# Patient Record
Sex: Female | Born: 1955 | Race: Black or African American | Hispanic: No | Marital: Single | Smoking: Current some day smoker
Health system: Southern US, Community
[De-identification: ages and names within clinical notes are randomized; demographics above are authoritative.]

## PROBLEM LIST (undated history)

## (undated) DIAGNOSIS — F329 Major depressive disorder, single episode, unspecified: Secondary | ICD-10-CM

## (undated) DIAGNOSIS — F419 Anxiety disorder, unspecified: Secondary | ICD-10-CM

## (undated) DIAGNOSIS — N979 Female infertility, unspecified: Secondary | ICD-10-CM

## (undated) DIAGNOSIS — R1110 Vomiting, unspecified: Secondary | ICD-10-CM

## (undated) DIAGNOSIS — R32 Unspecified urinary incontinence: Secondary | ICD-10-CM

## (undated) DIAGNOSIS — N809 Endometriosis, unspecified: Secondary | ICD-10-CM

## (undated) DIAGNOSIS — IMO0001 Reserved for inherently not codable concepts without codable children: Secondary | ICD-10-CM

## (undated) DIAGNOSIS — F32A Depression, unspecified: Secondary | ICD-10-CM

## (undated) DIAGNOSIS — K219 Gastro-esophageal reflux disease without esophagitis: Secondary | ICD-10-CM

## (undated) HISTORY — DX: Reserved for inherently not codable concepts without codable children: IMO0001

## (undated) HISTORY — DX: Unspecified urinary incontinence: R32

## (undated) HISTORY — DX: Vomiting, unspecified: R11.10

## (undated) HISTORY — PX: EXPLORATORY LAPAROTOMY: SUR591

## (undated) HISTORY — DX: Major depressive disorder, single episode, unspecified: F32.9

## (undated) HISTORY — PX: ESOPHAGOGASTRODUODENOSCOPY: SHX1529

## (undated) HISTORY — DX: Anxiety disorder, unspecified: F41.9

## (undated) HISTORY — DX: Female infertility, unspecified: N97.9

## (undated) HISTORY — DX: Depression, unspecified: F32.A

## (undated) HISTORY — DX: Endometriosis, unspecified: N80.9

## (undated) HISTORY — PX: OTHER SURGICAL HISTORY: SHX169

---

## 1997-11-12 ENCOUNTER — Emergency Department (HOSPITAL_COMMUNITY): Admission: EM | Admit: 1997-11-12 | Discharge: 1997-11-12 | Payer: Self-pay

## 2001-08-18 ENCOUNTER — Encounter (HOSPITAL_BASED_OUTPATIENT_CLINIC_OR_DEPARTMENT_OTHER): Payer: Self-pay | Admitting: General Surgery

## 2001-08-22 ENCOUNTER — Ambulatory Visit (HOSPITAL_COMMUNITY): Admission: RE | Admit: 2001-08-22 | Discharge: 2001-08-22 | Payer: Self-pay | Admitting: General Surgery

## 2001-08-22 ENCOUNTER — Encounter (INDEPENDENT_AMBULATORY_CARE_PROVIDER_SITE_OTHER): Payer: Self-pay | Admitting: *Deleted

## 2001-11-28 ENCOUNTER — Encounter: Admission: RE | Admit: 2001-11-28 | Discharge: 2001-12-29 | Payer: Self-pay | Admitting: Internal Medicine

## 2002-01-11 ENCOUNTER — Encounter: Admission: RE | Admit: 2002-01-11 | Discharge: 2002-02-19 | Payer: Self-pay | Admitting: Internal Medicine

## 2002-10-29 ENCOUNTER — Emergency Department (HOSPITAL_COMMUNITY): Admission: EM | Admit: 2002-10-29 | Discharge: 2002-10-30 | Payer: Self-pay | Admitting: Emergency Medicine

## 2003-12-22 ENCOUNTER — Emergency Department (HOSPITAL_COMMUNITY): Admission: EM | Admit: 2003-12-22 | Discharge: 2003-12-22 | Payer: Self-pay | Admitting: Emergency Medicine

## 2006-09-18 ENCOUNTER — Emergency Department (HOSPITAL_COMMUNITY): Admission: EM | Admit: 2006-09-18 | Discharge: 2006-09-18 | Payer: Self-pay | Admitting: Emergency Medicine

## 2007-07-06 ENCOUNTER — Other Ambulatory Visit: Admission: RE | Admit: 2007-07-06 | Discharge: 2007-07-06 | Payer: Self-pay | Admitting: Internal Medicine

## 2007-07-07 ENCOUNTER — Encounter: Admission: RE | Admit: 2007-07-07 | Discharge: 2007-07-07 | Payer: Self-pay | Admitting: Internal Medicine

## 2007-07-17 ENCOUNTER — Encounter: Payer: Self-pay | Admitting: Internal Medicine

## 2007-07-17 ENCOUNTER — Ambulatory Visit: Payer: Self-pay

## 2010-09-04 NOTE — Op Note (Signed)
[place]. [facility]  Patient:    Christine Shannon, Christine Shannon Visit Number: [id number] MRN: [id number]          Service Type: DSU Location: [facility] 2899 [id number] Attending Physician:  Sonda Primes Dictated by:   Mardene Celeste Lurene Shadow, M.D. Proc. Date: 08/22/01 Admit Date:  08/22/2001 Discharge Date: 08/22/2001                             Operative Report  PREOPERATIVE DIAGNOSIS:  Hidradenitis, left axilla.  POSTOPERATIVE DIAGNOSIS:  Hidradenitis, left axilla.  OPERATION PERFORMED:  Excision of hidradenitis, left axilla.  SURGEON:  Mardene Celeste. Lurene Shadow, M.D.  ASSISTANT:  Nurse.  ANESTHESIA:  General.  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  She comes to the operating room now for excision of this area of multiple inflammatory nodules under the left arm following a course of antibiotics. The risks and potential benefits of surgery have been fully discussed with the patient and all questions answered.  She understands these risks and gives consent.  DESCRIPTION OF PROCEDURE:  Following the induction of satisfactory anesthesia with the patient positioned supinely, the left arm extended laterally so to expose the axilla, the axilla was then prepped and draped to be included in the sterile operative field.  I outlined a large elliptical area of the axilla to include all of the palpable nodules of hidradenitis and incised down through the skin excising the ellipse in its entirety along with the underlying inflammatory tissues.  Hemostasis was maintained with electrocautery.  All areas of dissection checked for hemostasis and noted to be dry.  Sponge, instrument and sharp counts were verified.  The subcutaneous tissues were closed with interrupted 3-0 Vicryl sutures.  The skin was closed then with a 4-0 running subcuticular stitch and then reinforced  with Steri-Strips.  Sterile dressings were applied.  Anesthetic reversed.  Patient removed from the operating room to the recovery room in stable condition having tolerated the procedure well. Dictated by:   Mardene Celeste. Lurene Shadow, M.D. Attending Physician:  Sonda Primes DD:  08/22/01 TD:  08/23/01 Job: [id number]

## 2014-04-16 ENCOUNTER — Encounter: Payer: Self-pay | Admitting: Certified Nurse Midwife

## 2014-06-10 ENCOUNTER — Encounter: Payer: Self-pay | Admitting: Certified Nurse Midwife

## 2014-08-13 ENCOUNTER — Encounter: Payer: Self-pay | Admitting: Certified Nurse Midwife

## 2014-08-13 ENCOUNTER — Ambulatory Visit (INDEPENDENT_AMBULATORY_CARE_PROVIDER_SITE_OTHER): Payer: BLUE CROSS/BLUE SHIELD | Admitting: Certified Nurse Midwife

## 2014-08-13 VITALS — BP 118/76 | HR 76 | Resp 20 | Ht 60.0 in | Wt 158.0 lb

## 2014-08-13 DIAGNOSIS — Z124 Encounter for screening for malignant neoplasm of cervix: Secondary | ICD-10-CM | POA: Diagnosis not present

## 2014-08-13 DIAGNOSIS — Z23 Encounter for immunization: Secondary | ICD-10-CM | POA: Diagnosis not present

## 2014-08-13 DIAGNOSIS — Z0000 Encounter for general adult medical examination without abnormal findings: Secondary | ICD-10-CM | POA: Diagnosis not present

## 2014-08-13 DIAGNOSIS — R319 Hematuria, unspecified: Secondary | ICD-10-CM | POA: Diagnosis not present

## 2014-08-13 DIAGNOSIS — Z01419 Encounter for gynecological examination (general) (routine) without abnormal findings: Secondary | ICD-10-CM

## 2014-08-13 DIAGNOSIS — K625 Hemorrhage of anus and rectum: Secondary | ICD-10-CM | POA: Diagnosis not present

## 2014-08-13 LAB — POCT URINALYSIS DIPSTICK
Bilirubin, UA: NEGATIVE
Glucose, UA: NEGATIVE
Ketones, UA: NEGATIVE
Leukocytes, UA: NEGATIVE
Nitrite, UA: NEGATIVE
Protein, UA: NEGATIVE
Urobilinogen, UA: NEGATIVE
pH, UA: 5

## 2014-08-13 NOTE — Progress Notes (Signed)
59 y.o. . Single  African American Fe g2p2002 here to establish gyn care and  for annual exam. Post menopausal with no HRT used. Denies vaginal bleeding. Patient complaining of rectal bleeding with bowel movement and without stool. She notes blood in the toliet and from rectum when not having stool for the past 2-4 months. Denies hard stools or hemorrhoid concerns. Has old hemorrhoid tag, but not pain. Recent PCP visit and per patient CBC normal. Clomid use for one pregnancy. Slight urge incontinence occasionally, no pads use. No other UTI symptoms. Sees PCP yearly for hypertension management and labs. No other health issues today.  Patient's last menstrual period was 04/19/2002.          Sexually active: No.  The current method of family planning is post menopausal status.    Exercising: Yes.    walking & floor exercises Smoker:  no  Health Maintenance: Pap:  2000? neg MMG: 2005? normal Colonoscopy:  Greater 10 years(normal) BMD:   Unsure of year normal TDaP:  Unsure probably greater than 10 years Labs: Poct urine-rbc tr Self breast exam: done occ   reports that she has quit smoking. She does not have any smokeless tobacco history on file. She reports that she drinks alcohol. She reports that she does not use illicit drugs.  Past Medical History  Diagnosis Date  . Anxiety   . Depression   . Endometriosis   . Regurgitation     of heart  . Infertility, female   . Urinary incontinence     Past Surgical History  Procedure Laterality Date  . Exploratory laparotomy    . Cesarean section      2 c-sections  . Cyst removed      from under left armpit    No current outpatient prescriptions on file.   No current facility-administered medications for this visit.    Family History  Problem Relation Age of Onset  . Breast cancer Mother   . Stroke Mother   . Prostate cancer Father   . Diabetes Father     ROS:  Pertinent items are noted in HPI.  Otherwise, a comprehensive ROS was  negative.  Exam:   BP 118/76 mmHg  Pulse 76  Resp 20  Ht 5' (1.524 m)  Wt 158 lb (71.668 kg)  BMI 30.86 kg/m2  LMP 04/19/2002 Height: 5' (152.4 cm) Ht Readings from Last 3 Encounters:  08/13/14 5' (1.524 m)    General appearance: alert, cooperative and appears stated age Head: Normocephalic, without obvious abnormality, atraumatic Neck: no adenopathy, supple, symmetrical, trachea midline and thyroid normal to inspection and palpation Lungs: clear to auscultation bilaterally Breasts: normal appearance, no masses or tenderness, No nipple retraction or dimpling, No nipple discharge or bleeding, No axillary or supraclavicular adenopathy Heart: regular rate and rhythm Abdomen: soft, non-tender; no masses,  no organomegaly Extremities: extremities normal, atraumatic, no cyanosis or edema Skin: Skin color, texture, turgor normal. No rashes or lesions Lymph nodes: Cervical, supraclavicular, and axillary nodes normal. No abnormal inguinal nodes palpated Neurologic: Grossly normal   Pelvic: External genitalia:  no lesions              Urethra:  normal appearing urethra with no masses, tenderness or lesions              Bartholin's and Skene's: normal                 Vagina: normal appearing vagina with normal color and discharge, no lesions  Cervix: normal, non tender, no lesions              Pap taken: Yes.   Bimanual Exam:  Uterus:  normal size, contour, position, consistency, mobility, non-tender              Adnexa: normal adnexa and no mass, fullness, tenderness               Rectovaginal: Confirms               Anus:  normal sphincter tone, no lesions, small hemorrhoid tag noted, no blood on gloved finger from exam  Chaperone present: Yes  A:  Well Woman with normal exam  Post menopausal no HRT  Blood from rectum and with stools and without stools  Mammogram over due  Colonoscopy due  R/O UTI, occasional urgency  Immunization due    P:   Reviewed health and  wellness pertinent to exam  Discussed importance of advising if vaginal bleeding.  Discussed needs blood from rectum, needs evaluation. Agreeable. Referral to be made to Dr. Loreta AveMann, while patient is here. Discussed need for colonoscopy and she can discuss with GI.  Discussed importance of mammogram and given information sheet to schedule.  Lab: Urine micro,culture  Requests TDAP  Pap smear taken today with HPVHR   counseled on breast self exam, mammography screening, adequate intake of calcium and vitamin D, diet and exercise  return annually or prn  An After Visit Summary was printed and given to the patient.

## 2014-08-13 NOTE — Patient Instructions (Addendum)
EXERCISE AND DIET:  We recommended that you start or continue a regular exercise program for good health. Regular exercise means any activity that makes your heart beat faster and makes you sweat.  We recommend exercising at least 30 minutes per day at least 3 days a week, preferably 4 or 5.  We also recommend a diet low in fat and sugar.  Inactivity, poor dietary choices and obesity can cause diabetes, heart attack, stroke, and kidney damage, among others.    ALCOHOL AND SMOKING:  Women should limit their alcohol intake to no more than 7 drinks/beers/glasses of wine (combined, not each!) per week. Moderation of alcohol intake to this level decreases your risk of breast cancer and liver damage. And of course, no recreational drugs are part of a healthy lifestyle.  And absolutely no smoking or even second hand smoke. Most people know smoking can cause heart and lung diseases, but did you know it also contributes to weakening of your bones? Aging of your skin?  Yellowing of your teeth and nails?  CALCIUM AND VITAMIN D:  Adequate intake of calcium and Vitamin D are recommended.  The recommendations for exact amounts of these supplements seem to change often, but generally speaking 600 mg of calcium (either carbonate or citrate) and 800 units of Vitamin D per day seems prudent. Certain women may benefit from higher intake of Vitamin D.  If you are among these women, your doctor will have told you during your visit.    PAP SMEARS:  Pap smears, to check for cervical cancer or precancers,  have traditionally been done yearly, although recent scientific advances have shown that most women can have pap smears less often.  However, every woman still should have a physical exam from her gynecologist every year. It will include a breast check, inspection of the vulva and vagina to check for abnormal growths or skin changes, a visual exam of the cervix, and then an exam to evaluate the size and shape of the uterus and  ovaries.  And after 57 years of age, a rectal exam is indicated to check for rectal cancers. We will also provide age appropriate advice regarding health maintenance, like when you should have certain vaccines, screening for sexually transmitted diseases, bone density testing, colonoscopy, mammograms, etc.   MAMMOGRAMS:  All women over 40 years old should have a yearly mammogram. Many facilities now offer a "3D" mammogram, which may cost around $50 extra out of pocket. If possible,  we recommend you accept the option to have the 3D mammogram performed.  It both reduces the number of women who will be called back for extra views which then turn out to be normal, and it is better than the routine mammogram at detecting truly abnormal areas.    COLONOSCOPY:  Colonoscopy to screen for colon cancer is recommended for all women at age 50.  We know, you hate the idea of the prep.  We agree, BUT, having colon cancer and not knowing it is worse!!  Colon cancer so often starts as a polyp that can be seen and removed at colonscopy, which can quite literally save your life!  And if your first colonoscopy is normal and you have no family history of colon cancer, most women don't have to have it again for 10 years.  Once every ten years, you can do something that may end up saving your life, right?  We will be happy to help you get it scheduled when you are ready.    Be sure to check your insurance coverage so you understand how much it will cost.  It may be covered as a preventative service at no cost, but you should check your particular policy.     Rectal Bleeding Rectal bleeding is when blood passes out of the anus. It is usually a sign that something is wrong. It may not be serious, but it should always be evaluated. Rectal bleeding may present as bright red blood or extremely dark stools. The color may range from dark red or maroon to black (like tar). It is important that the cause of rectal bleeding be identified so  treatment can be started and the problem corrected. CAUSES   Hemorrhoids. These are enlarged (dilated) blood vessels or veins in the anal or rectal area.  Fistulas. Theseare abnormal, burrowing channels that usually run from inside the rectum to the skin around the anus. They can bleed.  Anal fissures. This is a tear in the tissue of the anus. Bleeding occurs with bowel movements.  Diverticulosis. This is a condition in which pockets or sacs project from the bowel wall. Occasionally, the sacs can bleed.  Diverticulitis. Thisis an infection involving diverticulosis of the colon.  Proctitis and colitis. These are conditions in which the rectum, colon, or both, can become inflamed and pitted (ulcerated).  Polyps and cancer. Polyps are non-cancerous (benign) growths in the colon that may bleed. Certain types of polyps turn into cancer.  Protrusion of the rectum. Part of the rectum can project from the anus and bleed.  Certain medicines.  Intestinal infections.  Blood vessel abnormalities. HOME CARE INSTRUCTIONS  Eat a high-fiber diet to keep your stool soft.  Limit activity.  Drink enough fluids to keep your urine clear or pale yellow.  Warm baths may be useful to soothe rectal pain.  Follow up with your caregiver as directed. SEEK IMMEDIATE MEDICAL CARE IF:  You develop increased bleeding.  You have black or dark red stools.  You vomit blood or material that looks like coffee grounds.  You have abdominal pain or tenderness.  You have a fever.  You feel weak, nauseous, or you faint.  You have severe rectal pain or you are unable to have a bowel movement. MAKE SURE YOU:  Understand these instructions.  Will watch your condition.  Will get help right away if you are not doing well or get worse. Document Released: 09/25/2001 Document Revised: 06/28/2011 Document Reviewed: 09/20/2010 [facility] Patient Information 2015 [employer], [place]. This information is not  intended to replace advice given to you by your health care provider. Make sure you discuss any questions you have with your health care provider.  Great to meet you today. Debbi

## 2014-08-13 NOTE — Progress Notes (Signed)
Reviewed personally.  M. Suzanne Lenzie Montesano, MD.  

## 2014-08-13 NOTE — Progress Notes (Signed)
Patient scheduled for appointment with Dr.Mann for evaluation of bright red rectal bleeding on Tuesday May 3rd at 9:45am. Patient is agreeable to date and time.

## 2014-08-14 LAB — URINALYSIS, MICROSCOPIC ONLY: CASTS: NONE SEEN

## 2014-08-15 LAB — URINE CULTURE

## 2014-08-15 LAB — IPS PAP TEST WITH HPV

## 2014-08-16 ENCOUNTER — Encounter: Payer: Self-pay | Admitting: Certified Nurse Midwife

## 2014-10-28 ENCOUNTER — Ambulatory Visit
Admission: RE | Admit: 2014-10-28 | Discharge: 2014-10-28 | Disposition: A | Discharge status: Home / Self Care | Payer: 59 | Source: Ambulatory Visit | Attending: Family Medicine | Admitting: Family Medicine

## 2014-10-28 ENCOUNTER — Other Ambulatory Visit: Payer: Self-pay | Admitting: Family Medicine

## 2014-10-28 DIAGNOSIS — M25511 Pain in right shoulder: Secondary | ICD-10-CM

## 2015-01-16 ENCOUNTER — Encounter: Payer: Self-pay | Admitting: Certified Nurse Midwife

## 2015-02-20 ENCOUNTER — Other Ambulatory Visit (HOSPITAL_COMMUNITY): Payer: Self-pay | Admitting: Otolaryngology

## 2015-02-20 DIAGNOSIS — R1310 Dysphagia, unspecified: Secondary | ICD-10-CM

## 2015-02-27 ENCOUNTER — Ambulatory Visit (HOSPITAL_COMMUNITY)
Admission: RE | Admit: 2015-02-27 | Discharge: 2015-02-27 | Disposition: A | Discharge status: Home / Self Care | Payer: 59 | Source: Ambulatory Visit | Attending: Otolaryngology | Admitting: Otolaryngology

## 2015-02-27 ENCOUNTER — Other Ambulatory Visit (HOSPITAL_COMMUNITY): Payer: 59

## 2015-02-27 DIAGNOSIS — K222 Esophageal obstruction: Secondary | ICD-10-CM | POA: Insufficient documentation

## 2015-02-27 DIAGNOSIS — R1310 Dysphagia, unspecified: Secondary | ICD-10-CM

## 2015-02-27 DIAGNOSIS — K224 Dyskinesia of esophagus: Secondary | ICD-10-CM | POA: Diagnosis not present

## 2015-02-27 DIAGNOSIS — K449 Diaphragmatic hernia without obstruction or gangrene: Secondary | ICD-10-CM | POA: Diagnosis not present

## 2015-02-27 DIAGNOSIS — K219 Gastro-esophageal reflux disease without esophagitis: Secondary | ICD-10-CM | POA: Diagnosis not present

## 2015-03-05 ENCOUNTER — Encounter: Payer: Self-pay | Admitting: Gastroenterology

## 2015-03-05 ENCOUNTER — Encounter: Payer: Self-pay | Admitting: Physician Assistant

## 2015-03-20 ENCOUNTER — Ambulatory Visit: Payer: 59 | Admitting: Physician Assistant

## 2015-05-07 ENCOUNTER — Encounter: Payer: Self-pay | Admitting: Gastroenterology

## 2015-05-07 ENCOUNTER — Ambulatory Visit (INDEPENDENT_AMBULATORY_CARE_PROVIDER_SITE_OTHER): Payer: 59 | Admitting: Gastroenterology

## 2015-05-07 VITALS — BP 132/80 | HR 76 | Ht 60.0 in | Wt 161.2 lb

## 2015-05-07 DIAGNOSIS — R1310 Dysphagia, unspecified: Secondary | ICD-10-CM | POA: Diagnosis not present

## 2015-05-07 NOTE — Patient Instructions (Signed)
You will be set up for an upper endoscopy with balloon dilation for dysphagia.

## 2015-05-07 NOTE — Progress Notes (Signed)
HPI: This is a    very pleasant 60 year old woman   who was referred to me by Melvenia Beam, MD  to evaluate  dysphasia .    Chief complaint is dysphagia  Swallowing difficulty for a long time for 10 years, this became worse this past summer.  Overall stable weight.  NO overt gi bleeding.  No pyrosis.  She was not happy with her interaction with Dr. Loreta Ave this past summer..    She has  Dysphagia to solids and liquids, daily now.  Sometimes twice a day. Will regurgitate it out usually.   11/2014 Colonoscopy Dr. Loreta Ave (done for rectal bleeding): essentially normal, no polyps: recommended repeat colonoscopy ni 10 years for screening 11/2014 EGD Dr. 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 Normal otherwise examination. She was recommended to return to the office when necessary   02/2015 MBSS: Pt presents with normal oropharyngeal swallow with adequate mastication, timely swallow initiation, good pharyngeal clearance, no penetration nor aspiration, patent UES with easy passage into esophagus. Pt is scheduled for barium swallow immediately after current study. No SLP f/u warranted. `   02/2015 barium esophagram: 1. Normal oral and pharyngeal phases of swallowing, with no laryngeal penetration or tracheobronchial aspiration. 2. Mild-to-moderate gastroesophageal reflux. Small sliding hiatal hernia. 3. 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 Swallowed a 13 mm barium tablet became lodged in the lower thoracic esophagus at the site of the stricture. 4. Mild esophageal dysmotility, likely due to chronic gastroesophageal reflux disease   Review of systems: Pertinent positive and negative review of systems were noted in the above HPI section. Complete review of systems was performed and was otherwise normal.   Past Medical History  Diagnosis Date  . Anxiety   . Depression    . Endometriosis   . Regurgitation     of heart  . Infertility, female   . Urinary incontinence     Past Surgical History  Procedure Laterality Date  . Exploratory laparotomy    . Cesarean section      2 c-sections  . Cyst removed      from under left armpit  . Esophagogastroduodenoscopy      Dr Loreta Ave    No current outpatient prescriptions on file.   No current facility-administered medications for this visit.    Allergies as of 05/07/2015  . (No Known Allergies)    Family History  Problem Relation Age of Onset  . Breast cancer Mother 23    very early first stage BRACA negative  . Stroke Mother   . Prostate cancer Father   . Diabetes Father     Social History   Social History  . Marital Status: Single    Spouse Name: N/A  . Number of Children: N/A  . Years of Education: N/A   Occupational History  . Not on file.   Social History Main Topics  . Smoking status: Former Games developer  . Smokeless tobacco: Never Used  . Alcohol Use: 0.0 oz/week    0 Standard drinks or equivalent per week     Comment: 1-2 a month  . Drug Use: No  . Sexual Activity:    Partners: Female, Female    Birth Control/ Protection: Post-menopausal   Other Topics Concern  . Not on file   Social History Narrative     Physical Exam: BP 132/80 mmHg  Pulse 76  Ht 5' (1.524 m)  [employer]  161 lb 4 oz (73.143 kg)  BMI 31.49 kg/m2  LMP 04/19/2002 Constitutional: generally well-appearing Psychiatric: alert and oriented x3 Eyes: extraocular movements intact Mouth: oral pharynx moist, no lesions Neck: supple no lymphadenopathy Cardiovascular: heart regular rate and rhythm Lungs: clear to auscultation bilaterally Abdomen: soft, nontender, nondistended, no obvious ascites, no peritoneal signs, normal bowel sounds Extremities: no lower extremity edema bilaterally Skin: no lesions on visible extremities   Assessment and plan: 60 y.o. female with  mixed solid, liquid dysphagia, abnormal barium  esophagram  I recommended we proceed with EGD at her soonest convenience and I would plan to perform dilation of the GE junction likely with a balloon. She knows to chew her food well eating slowly and take small bites in the meantime. She does not have any GERD symptoms to speak of but if there is a significant stricturing probably put her empirically on proton pump inhibitor following dilation. I see no reason for any further blood tests or imaging studies before this.   Rob Bunting, MD [facility] 05/07/2015, 1:33 PM  Cc: Melvenia Beam, MD

## 2015-06-17 ENCOUNTER — Encounter: Payer: Self-pay | Admitting: Gastroenterology

## 2015-06-17 ENCOUNTER — Ambulatory Visit (AMBULATORY_SURGERY_CENTER): Payer: 59 | Admitting: Gastroenterology

## 2015-06-17 VITALS — BP 139/77 | HR 65 | Temp 98.6°F | Resp 13 | Ht 60.0 in | Wt 161.0 lb

## 2015-06-17 DIAGNOSIS — R1310 Dysphagia, unspecified: Secondary | ICD-10-CM

## 2015-06-17 DIAGNOSIS — K222 Esophageal obstruction: Secondary | ICD-10-CM | POA: Diagnosis not present

## 2015-06-17 MED ORDER — OMEPRAZOLE 40 MG PO CPDR
40.0000 mg | DELAYED_RELEASE_CAPSULE | Freq: Every day | ORAL | Status: DC
Start: 2015-06-17 — End: 2022-02-02

## 2015-06-17 MED ORDER — SODIUM CHLORIDE 0.9 % IV SOLN: 500.0000 mL | INTRAVENOUS | Status: DC

## 2015-06-17 NOTE — Op Note (Signed)
[facility] [address] [place], [address]   ENDOSCOPY PROCEDURE REPORT  PATIENT: Christine, Shannon  MR#: [id number] BIRTHDATE: 04-Oct-1955 , 59  yrs. old GENDER: female ENDOSCOPIST: Rachael Fee, MD REFERRED BY:  Melvenia Beam, MD PROCEDURE DATE:  06/17/2015 PROCEDURE:  EGD w/ balloon dilation and EGD w/ biopsy ASA CLASS:     Class II INDICATIONS:  Dysphagia for many years, worse lately; 11/2014 EGD Dr. 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  Normal otherwise examination.  She was recommended to return to the office when necessary. MEDICATIONS: Monitored anesthesia care and Propofol 200 mg IV TOPICAL ANESTHETIC: none  DESCRIPTION OF PROCEDURE: After the risks benefits and alternatives of the procedure were thoroughly explained, informed consent was obtained.  The LB [id number] endoscope was introduced through the mouth and advanced to the second portion of the duodenum , Without limitations.  The instrument was slowly withdrawn as the mucosa was fully examined.  There was a smooth, fairly focal stricture at GE junction.  The lumen was 8mm across, mucosa in that region (distal esophagus) was somewhat granular appearing but not neoplastic.  The stricture was slightly dilated with scope passage and then I used an 11mm TTS balloon to dilate it further.  There was the usual superficial mucosal tear and self limited oozing of blood following dilation. The esophagus was biopsied (distal and proximal) and sent to pathology.  The examination was otherwise normal.  Retroflexed views revealed no abnormalities.     The scope was then withdrawn from the patient and the procedure completed.  COMPLICATIONS: There were no immediate complications.  ENDOSCOPIC IMPRESSION: There was a smooth, fairly focal stricture at GE junction.  The lumen was 8mm across, mucosa in that region (distal esophagus) was somewhat  granular appearing but not neoplastic.  The stricture was slightly dilated with scope passage and then I used an 11mm TTS balloon to dilate it further.  There was the usual superficial mucosal tear and self limited oozing of blood following dilation. The esophagus was biopsied (distal and proximal) and sent to pathology.  The examination was otherwise normal  RECOMMENDATIONS: Await biopsy results You will likely need repeat dilation, await biopsy result to determine timing. Please start once daily omeprazole (called into your pharmacy today).  This may be increased to twice daily pending biopsy results.   eSigned:  Rachael Fee, MD 06/17/2015 9:47 AM     PATIENT NAME:  Christine, Shannon MR#: [id number]

## 2015-06-17 NOTE — Patient Instructions (Signed)
YOU HAD AN ENDOSCOPIC PROCEDURE TODAY AT THE [facility]:   Refer to the procedure report that was given to you for any specific questions about what was found during the examination.  If the procedure report does not answer your questions, please call your gastroenterologist to clarify.  If you requested that your care partner not be given the details of your procedure findings, then the procedure report has been included in a sealed envelope for you to review at your convenience later.  YOU SHOULD EXPECT: Some feelings of bloating in the abdomen. Passage of more gas than usual.  Walking can help get rid of the air that was put into your GI tract during the procedure and reduce the bloating. If you had a lower endoscopy (such as a colonoscopy or flexible sigmoidoscopy) you may notice spotting of blood in your stool or on the toilet paper. If you underwent a bowel prep for your procedure, you may not have a normal bowel movement for a few days.  Please Note:  You might notice some irritation and congestion in your nose or some drainage.  This is from the oxygen used during your procedure.  There is no need for concern and it should clear up in a day or so.  SYMPTOMS TO REPORT IMMEDIATELY:     Following upper endoscopy (EGD)  Vomiting of blood or coffee ground material  New chest pain or pain under the shoulder blades  Painful or persistently difficult swallowing  New shortness of breath  Fever of 100F or higher  Black, tarry-looking stools  For urgent or emergent issues, a gastroenterologist can be reached at any hour by calling [phone].   DIET:  Follow Dilation Handout.    ACTIVITY:  You should plan to take it easy for the rest of today and you should NOT DRIVE or use heavy machinery until tomorrow (because of the sedation medicines used during the test).    FOLLOW UP: Our staff will call the number listed on your records the next business day following your  procedure to check on you and address any questions or concerns that you may have regarding the information given to you following your procedure. If we do not reach you, we will leave a message.  However, if you are feeling well and you are not experiencing any problems, there is no need to return our call.  We will assume that you have returned to your regular daily activities without incident.  If any biopsies were taken you will be contacted by phone or by letter within the next 1-3 weeks.  Please call us at [phone] if you have not heard about the biopsies in 3 weeks.    SIGNATURES/CONFIDENTIALITY: You and/or your care partner have signed paperwork which will be entered into your electronic medical record.  These signatures attest to the fact that that the information above on your After Visit Summary has been reviewed and is understood.  Full responsibility of the confidentiality of this discharge information lies with you and/or your care-partner.   Resume medications. Information given on dilation diet. 

## 2015-06-17 NOTE — Progress Notes (Signed)
Called to room to assist during endoscopic procedure.  Patient ID and intended procedure confirmed with present staff. Received instructions for my participation in the procedure from the performing physician.  

## 2015-06-17 NOTE — Progress Notes (Signed)
To PACU   Pt awake and alert report toRN 

## 2015-06-18 ENCOUNTER — Telehealth: Payer: Self-pay | Admitting: *Deleted

## 2015-06-18 NOTE — Telephone Encounter (Signed)
  Follow up Call-  Call back number 06/17/2015  Post procedure Call Back phone  # [phone]  Permission to leave phone message Yes     Patient questions:  Do you have a fever, pain , or abdominal swelling? No. Pain Score  0 *  Have you tolerated food without any problems? Yes.    Have you been able to return to your normal activities? Yes.    Do you have any questions about your discharge instructions: Diet   No. Medications  No. Follow up visit  No.  Do you have questions or concerns about your Care? No.  Actions: * If pain score is 4 or above: No action needed, pain <4.

## 2015-06-30 ENCOUNTER — Telehealth: Payer: Self-pay | Admitting: Gastroenterology

## 2015-06-30 NOTE — Telephone Encounter (Addendum)
Left message on machine to call back, the pt is due for repeat EGD in late April, she will be called around that time to set up the procedure.

## 2015-07-01 NOTE — Telephone Encounter (Signed)
The pt has been advised that the procedure will be set up a little closer to April.  She is aware to call if she has not heard from this office by April 1.  Pt agreed

## 2015-07-22 ENCOUNTER — Telehealth: Payer: Self-pay

## 2015-07-22 NOTE — Telephone Encounter (Signed)
-----   Message from Donata DuffPatty L Lewis, RN sent at 07/01/2015 10:21 AM EDT ----- Pt needs EGD

## 2015-07-29 NOTE — Telephone Encounter (Signed)
Left message on machine to call back  

## 2015-07-30 NOTE — Telephone Encounter (Signed)
Left message on machine to call back letter mailed. 

## 2015-08-21 ENCOUNTER — Ambulatory Visit: Payer: BLUE CROSS/BLUE SHIELD | Admitting: Certified Nurse Midwife

## 2017-02-20 IMAGING — CR DG SHOULDER 2+V*R*
3 series · 3 of 3 positions shown · non-contrast
Comparison: No priors.

CLINICAL DATA: 59-year-old female with worsening right shoulder
pain for the past 2 weeks. Limited range of motion.

EXAM:
RIGHT SHOULDER - 2+ VIEW

[w shoulder grashey right]
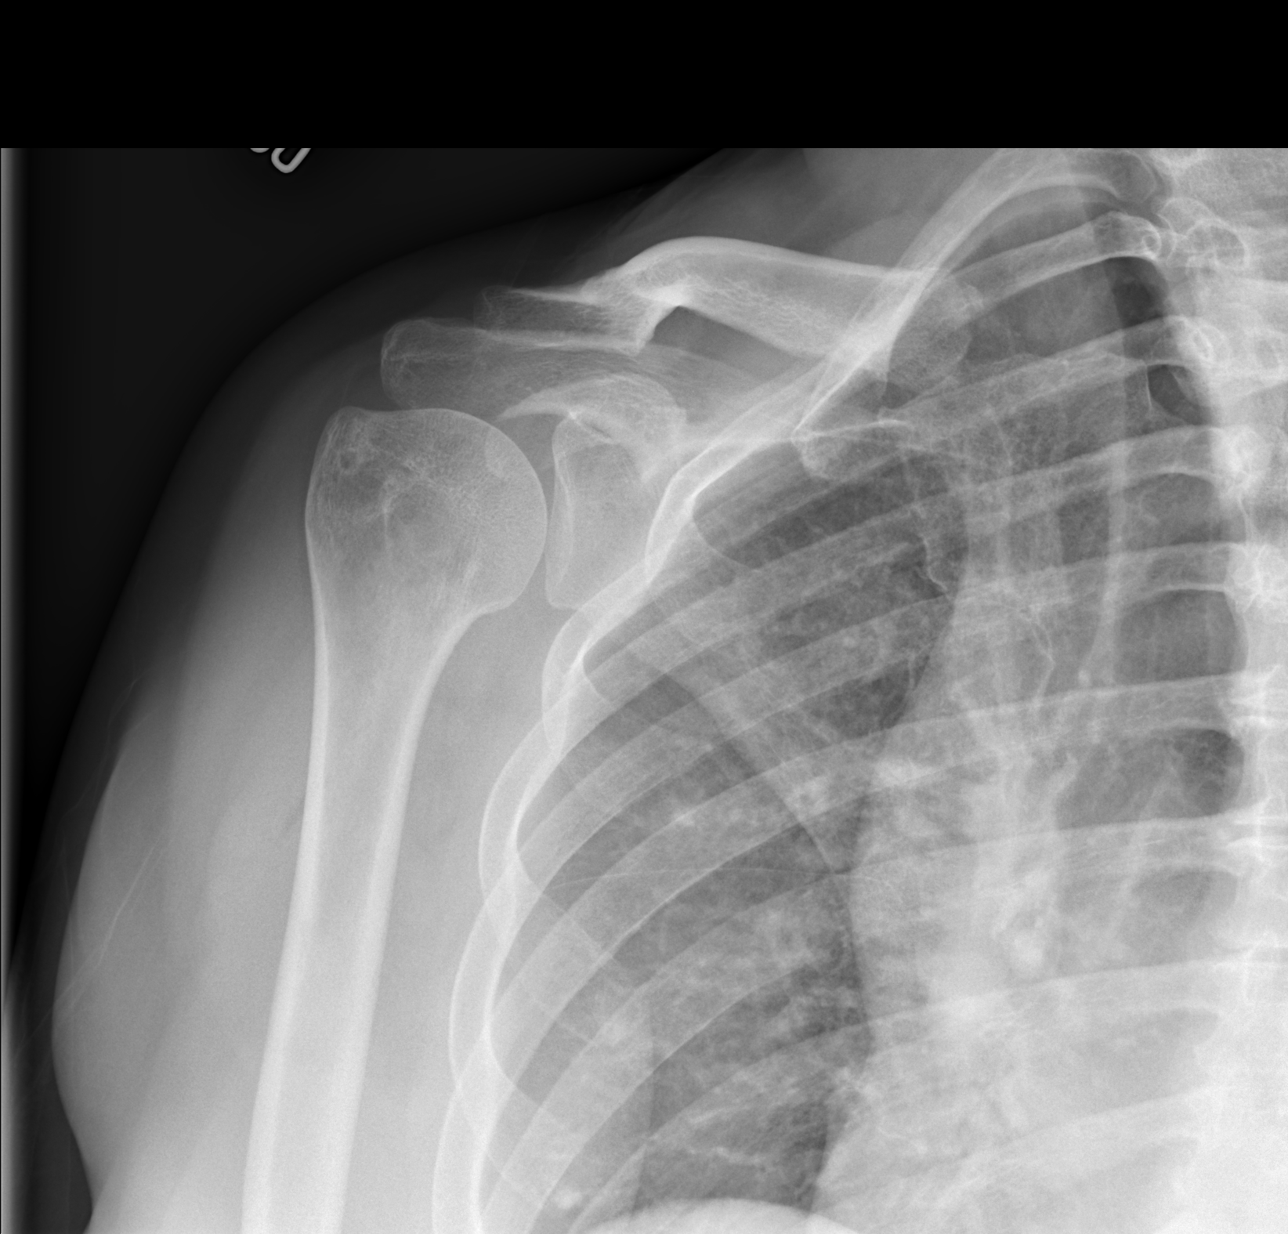

[w shoulder y-view right]
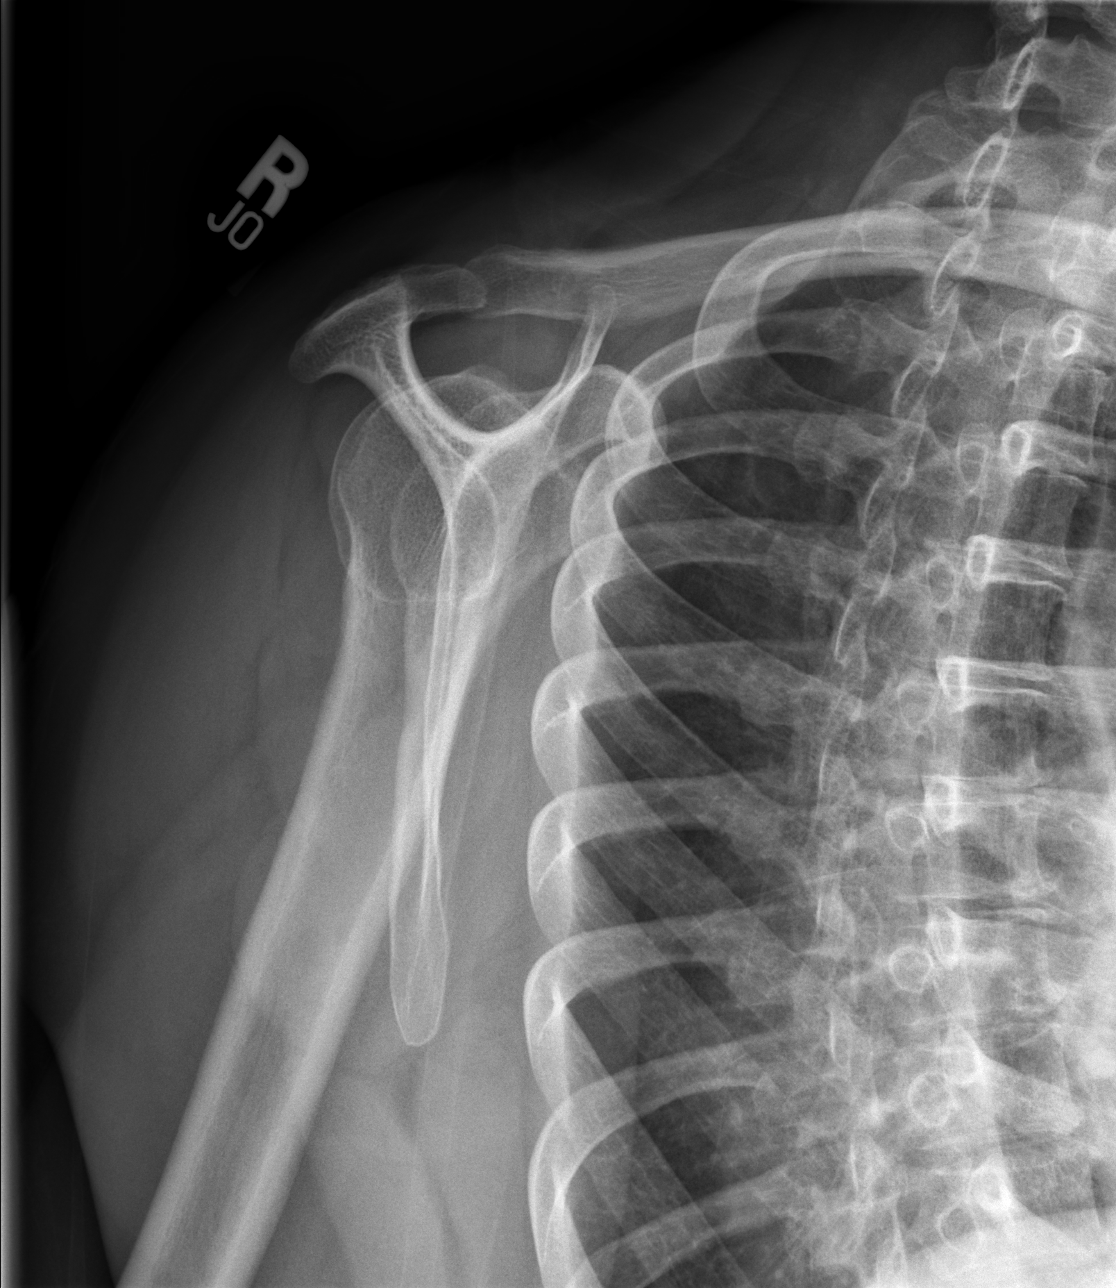

[x shoulder axillary right]
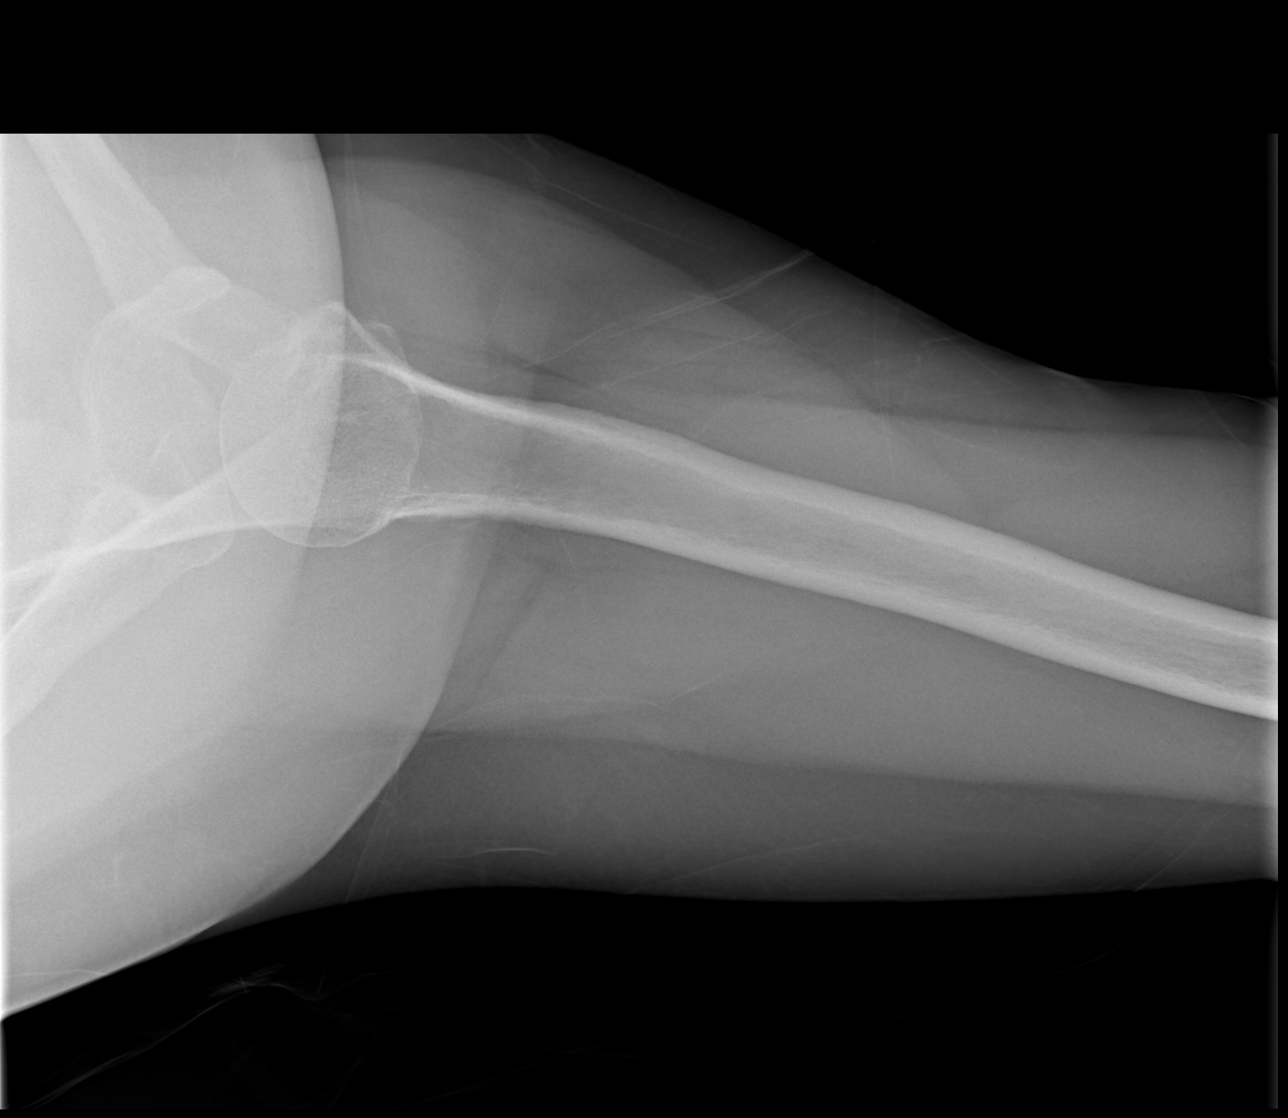

[3 of 3 positions shown; findings below may reference images not displayed]

FINDINGS: Multiple views of the right shoulder demonstrate no acute displaced
fracture, subluxation, dislocation, or soft tissue abnormality.
IMPRESSION: No acute radiographic abnormality of the right shoulder.

## 2020-02-29 ENCOUNTER — Other Ambulatory Visit: Payer: Self-pay | Admitting: Family Medicine

## 2020-02-29 ENCOUNTER — Ambulatory Visit
Admission: RE | Admit: 2020-02-29 | Discharge: 2020-02-29 | Disposition: A | Discharge status: Home / Self Care | Payer: 59 | Source: Ambulatory Visit | Attending: Family Medicine | Admitting: Family Medicine

## 2020-02-29 ENCOUNTER — Other Ambulatory Visit: Payer: Self-pay

## 2020-02-29 DIAGNOSIS — R062 Wheezing: Secondary | ICD-10-CM

## 2020-02-29 DIAGNOSIS — R059 Cough, unspecified: Secondary | ICD-10-CM

## 2020-03-24 ENCOUNTER — Other Ambulatory Visit: Payer: Self-pay | Admitting: Family Medicine

## 2020-03-24 DIAGNOSIS — Z1231 Encounter for screening mammogram for malignant neoplasm of breast: Secondary | ICD-10-CM

## 2020-03-24 DIAGNOSIS — E2839 Other primary ovarian failure: Secondary | ICD-10-CM

## 2020-06-30 ENCOUNTER — Other Ambulatory Visit: Payer: 59

## 2020-06-30 ENCOUNTER — Inpatient Hospital Stay: Admission: RE | Admit: 2020-06-30 | Payer: 59 | Source: Ambulatory Visit

## 2020-08-22 ENCOUNTER — Ambulatory Visit: Payer: 59

## 2022-01-05 ENCOUNTER — Encounter: Payer: Self-pay | Admitting: Gastroenterology

## 2022-01-17 DIAGNOSIS — J189 Pneumonia, unspecified organism: Secondary | ICD-10-CM

## 2022-01-17 HISTORY — DX: Pneumonia, unspecified organism: J18.9

## 2022-01-20 ENCOUNTER — Encounter (HOSPITAL_COMMUNITY): Payer: Self-pay

## 2022-01-20 ENCOUNTER — Ambulatory Visit (HOSPITAL_COMMUNITY)
Admission: EM | Admit: 2022-01-20 | Discharge: 2022-01-20 | Disposition: A | Discharge status: Home / Self Care | Payer: 59 | Attending: Emergency Medicine | Admitting: Emergency Medicine

## 2022-01-20 DIAGNOSIS — J069 Acute upper respiratory infection, unspecified: Secondary | ICD-10-CM | POA: Diagnosis present

## 2022-01-20 DIAGNOSIS — Z20822 Contact with and (suspected) exposure to covid-19: Secondary | ICD-10-CM | POA: Insufficient documentation

## 2022-01-20 MED ORDER — PROMETHAZINE-DM 6.25-15 MG/5ML PO SYRP: 5.0000 mL | ORAL_SOLUTION | Freq: Four times a day (QID) | ORAL | 0 refills | Status: DC | PRN

## 2022-01-20 MED ORDER — ALBUTEROL SULFATE HFA 108 (90 BASE) MCG/ACT IN AERS: 2.0000 | INHALATION_SPRAY | Freq: Four times a day (QID) | RESPIRATORY_TRACT | 2 refills | Status: DC | PRN

## 2022-01-20 MED ORDER — BENZONATATE 100 MG PO CAPS: 100.0000 mg | ORAL_CAPSULE | Freq: Three times a day (TID) | ORAL | 0 refills | Status: DC

## 2022-01-20 MED ORDER — PREDNISONE 20 MG PO TABS: 40.0000 mg | ORAL_TABLET | Freq: Every day | ORAL | 0 refills | Status: DC

## 2022-01-20 NOTE — ED Provider Notes (Signed)
[place]    CSN: [id number] Arrival date & time: 01/20/22  0802      History   Chief Complaint Chief Complaint  Patient presents with   Shortness of Breath   Cough    HPI Christine Shannon is a 66 y.o. female.   Patient presents with chills, nasal congestion, right-sided ear pain, rhinorrhea, sore throat, constant generalized headache, productive cough, chest soreness, shortness of breath with exertion and intermittent wheezing for 3 days.ear pain has resolved.  No known sick contacts.  Decreased appetite but tolerating some food and fluids.  Current smoker.  Has attempted use of Robitussin which has been helpful.   Past Medical History:  Diagnosis Date   Anxiety    Depression    Endometriosis    Infertility, female    Regurgitation    of heart   Urinary incontinence     There are no problems to display for this patient.   Past Surgical History:  Procedure Laterality Date   CESAREAN SECTION     2 c-sections   cyst removed     from under left armpit   ESOPHAGOGASTRODUODENOSCOPY     Dr Collene Mares   EXPLORATORY LAPAROTOMY      OB History     Gravida  2   Para  2   Term  2   Preterm      AB      Living  2      SAB      IAB      Ectopic      Multiple      Live Births  2            Home Medications    Prior to Admission medications   Medication Sig Start Date End Date Taking? Authorizing Provider  omeprazole (PRILOSEC) 40 MG capsule Take 1 capsule (40 mg total) by mouth daily. 06/17/15   Milus Banister, MD    Family History Family History  Problem Relation Age of Onset   Breast cancer Mother 13       very early first stage BRACA negative   Stroke Mother    Prostate cancer Father    Diabetes Father     Social History Social History   Tobacco Use   Smoking status: Former   Smokeless tobacco: Never  Substance Use Topics   Alcohol use: Yes    Alcohol/week: 0.0 standard drinks of alcohol    Comment: 1-2 a month    Drug use: No     Allergies   Patient has no known allergies.   Review of Systems Review of Systems  Constitutional:  Positive for chills. Negative for activity change, appetite change, diaphoresis, fatigue, fever and unexpected weight change.  HENT:  Positive for congestion, ear pain, rhinorrhea and sore throat. Negative for dental problem, drooling, ear discharge, facial swelling, hearing loss, mouth sores, nosebleeds, postnasal drip, sinus pressure, sinus pain, sneezing, tinnitus, trouble swallowing and voice change.   Respiratory:  Positive for cough, shortness of breath and wheezing. Negative for apnea, choking, chest tightness and stridor.   Cardiovascular: Negative.   Gastrointestinal: Negative.   Skin: Negative.   Neurological:  Positive for headaches. Negative for dizziness, tremors, seizures, syncope, facial asymmetry, speech difficulty, weakness, light-headedness and numbness.     Physical Exam Triage Vital Signs ED Triage Vitals  Enc Vitals Group     BP 01/20/22 0823 (!) 185/95     Pulse Rate 01/20/22 0823 87  Resp 01/20/22 0823 12     Temp 01/20/22 0823 99.3 F (37.4 C)     Temp Source 01/20/22 0823 Oral     SpO2 01/20/22 0823 97 %     Weight 01/20/22 0827 150 lb (68 kg)     Height 01/20/22 0827 4\' 11"  (1.499 m)     Head Circumference --      Peak Flow --      Pain Score 01/20/22 0826 7     Pain Loc --      Pain Edu? --      Excl. in GC? --    No data found.  Updated Vital Signs BP (!) 185/95 (BP Location: Left Arm)   Pulse 87   Temp 99.3 F (37.4 C) (Oral)   Resp 12   Ht 4\' 11"  (1.499 m)   Wt 150 lb (68 kg)   LMP 04/19/2002   SpO2 97%   BMI 30.30 kg/m   Visual Acuity Right Eye Distance:   Left Eye Distance:   Bilateral Distance:    Right Eye Near:   Left Eye Near:    Bilateral Near:     Physical Exam Constitutional:      Appearance: Normal appearance.  HENT:     Head: Normocephalic.     Right Ear: Tympanic membrane, ear canal and  external ear normal.     Left Ear: Tympanic membrane, ear canal and external ear normal.     Nose: Congestion and rhinorrhea present.     Mouth/Throat:     Mouth: Mucous membranes are moist.     Pharynx: Oropharynx is clear.  Eyes:     Extraocular Movements: Extraocular movements intact.  Cardiovascular:     Rate and Rhythm: Normal rate and regular rhythm.     Pulses: Normal pulses.     Heart sounds: Normal heart sounds.  Pulmonary:     Effort: Pulmonary effort is normal.     Comments: Wheezing to bilateral lower lobes  Chest:     Comments: Tenderness generalized to the bilateral chest wall Skin:    General: Skin is warm and dry.  Neurological:     Mental Status: She is alert and oriented to person, place, and time. Mental status is at baseline.  Psychiatric:        Mood and Affect: Mood normal.        Behavior: Behavior normal.      UC Treatments / Results  Labs (all labs ordered are listed, but only abnormal results are displayed) Labs Reviewed - No data to display  EKG   Radiology No results found.  Procedures Procedures (including critical care time)  Medications Ordered in UC Medications - No data to display  Initial Impression / Assessment and Plan / UC Course  I have reviewed the triage vital signs and the nursing notes.  Pertinent labs & imaging results that were available during my care of the patient were reviewed by me and considered in my medical decision making (see chart for details).  Viral URI with cough  Patient is in no signs of distress nor toxic appearing.  Vital signs are stable.  Low suspicion for pneumonia, pneumothorax or bronchitis and therefore will defer imaging.  COVID test is pending, reviewed quarantine guidelines per CDC recommendations  Prescribed prednisone, albuterol inhaler, Tessalon and Promethazine DM   May use additional over-the-counter medications as needed for supportive care.  May follow-up with urgent care as needed  if symptoms persist or worsen.  Note given.   Final Clinical Impressions(s) / UC Diagnoses   Final diagnoses:  None   Discharge Instructions   None    ED Prescriptions   None    PDMP not reviewed this encounter.   Valinda Hoar, [place] 01/20/22 [phone]

## 2022-01-20 NOTE — ED Triage Notes (Signed)
Pt is here for chest congestion that, has gotten better but, pt has SOB, cough, body aches , chills, ear pain on and off, sore throat on and off,  and headache 3 days

## 2022-01-20 NOTE — Discharge Instructions (Signed)
Your symptoms today are most likely being caused by a virus and should steadily improve in time it can take up to 7 to 10 days before you truly start to see a turnaround however things will get better  Start prednisone every morning with food for the next 5 days, this is to reduce inflammation and irritation to the airways which should calm your shortness of breath and wheezing  You may use albuterol inhaler taking 2 puffs every 4-6 hours as needed for wheezing and shortness of breath  You may take Tessalon pill every 8 hours to help calm your coughing  You may use cough syrup every 6 hours for additional comfort, be mindful this medication may make you drowsy    You can take Tylenol and/or Ibuprofen as needed for fever reduction and pain relief.   For cough: honey 1/2 to 1 teaspoon (you can dilute the honey in water or another fluid).  You can also use guaifenesin for cough. You can use a humidifier for chest congestion and cough.  If you don't have a humidifier, you can sit in the bathroom with the hot shower running.      For sore throat: try warm salt water gargles, cepacol lozenges, throat spray, warm tea or water with lemon/honey, popsicles or ice, or OTC cold relief medicine for throat discomfort.   For congestion: take a daily anti-histamine like Zyrtec, Claritin, and a oral decongestant, such as pseudoephedrine.  You can also use Flonase 1-2 sprays in each nostril daily.   It is important to stay hydrated: drink plenty of fluids (water, gatorade/powerade/pedialyte, juices, or teas) to keep your throat moisturized and help further relieve irritation/discomfort.

## 2022-01-21 LAB — SARS CORONAVIRUS 2 (TAT 6-24 HRS): SARS Coronavirus 2: NEGATIVE

## 2022-01-22 ENCOUNTER — Ambulatory Visit (HOSPITAL_COMMUNITY)
Admission: EM | Admit: 2022-01-22 | Discharge: 2022-01-22 | Disposition: A | Discharge status: Home / Self Care | Payer: 59 | Attending: Family Medicine | Admitting: Family Medicine

## 2022-01-22 ENCOUNTER — Ambulatory Visit (INDEPENDENT_AMBULATORY_CARE_PROVIDER_SITE_OTHER): Payer: 59

## 2022-01-22 ENCOUNTER — Encounter (HOSPITAL_COMMUNITY): Payer: Self-pay

## 2022-01-22 DIAGNOSIS — J189 Pneumonia, unspecified organism: Secondary | ICD-10-CM

## 2022-01-22 DIAGNOSIS — R062 Wheezing: Secondary | ICD-10-CM

## 2022-01-22 DIAGNOSIS — R059 Cough, unspecified: Secondary | ICD-10-CM | POA: Diagnosis not present

## 2022-01-22 DIAGNOSIS — R051 Acute cough: Secondary | ICD-10-CM

## 2022-01-22 DIAGNOSIS — I10 Essential (primary) hypertension: Secondary | ICD-10-CM

## 2022-01-22 DIAGNOSIS — R519 Headache, unspecified: Secondary | ICD-10-CM

## 2022-01-22 MED ORDER — AZITHROMYCIN 250 MG PO TABS: 250.0000 mg | ORAL_TABLET | Freq: Every day | ORAL | 0 refills | Status: DC

## 2022-01-22 MED ORDER — AMLODIPINE BESYLATE 5 MG PO TABS: 5.0000 mg | ORAL_TABLET | Freq: Every day | ORAL | 0 refills | Status: DC

## 2022-01-22 NOTE — ED Triage Notes (Signed)
Pt is here for elevated  blood pressure x2days.

## 2022-01-22 NOTE — Discharge Instructions (Addendum)
You were seen today for elevated blood pressure for several days.  I have sent out amlodipine to take once/day for this.  Please monitor your blood pressure over the next several days.  If your blood pressure is not improving, or you have worsening headache, blurry vision or dizziness, please go to the ER for evaluation.   Please stop the prednisone, and decreased salt in your diet.  Please follow up with your primary care provider for follow up of your blood pressure.  Your chest xray shows possible pneumonia in the left lower lung.  I have sent out an antibiotic for you today for this.   If you have worsening cough, or develop fever or shortness of breath then please go to the ER. Again, please follow up with your primary care as you may need to a repeat xray to make sure this resolves.

## 2022-01-22 NOTE — ED Provider Notes (Signed)
[place]    CSN: [id number] Arrival date & time: 01/22/22  0801      History   Chief Complaint Chief Complaint  Patient presents with   Hypertension    HPI Christine BOYLAN is a 66 y.o. female.   Patient is here for elevated blood pressure.   She went to [employer], and her bp was 194/102.  She has had a headache sick being sick about 5 days ago.   She was seen here on 10/4 for URI symptoms.  She was given mediations, including prednisone, albuerol hfa and tessalon perles.  She did not take the prednisone this morning as a result of the headache and elevated blood pressure.  She did take aleve last night for the headache, and that did help with the headache.  She is having some blurry vision, better compared to yesterday.   No nausea or vomiting.  Slightly upset stomach this morning, but thinks due to stress.  Her bp was elevated on the 10/4, but she has no dx of htn, does not have a pcp and does not visit a doctor regularly.  In the past her bp was elevated.  Started walking, eating better, etc.   She thinks some of how she is feeling is due to the continued cough and just not feeling well due to that.   Past Medical History:  Diagnosis Date   Anxiety    Depression    Endometriosis    Infertility, female    Regurgitation    of heart   Urinary incontinence     There are no problems to display for this patient.   Past Surgical History:  Procedure Laterality Date   CESAREAN SECTION     2 c-sections   cyst removed     from under left armpit   ESOPHAGOGASTRODUODENOSCOPY     Dr Collene Mares   EXPLORATORY LAPAROTOMY      OB History     Gravida  2   Para  2   Term  2   Preterm      AB      Living  2      SAB      IAB      Ectopic      Multiple      Live Births  2            Home Medications    Prior to Admission medications   Medication Sig Start Date End Date Taking? Authorizing Provider  albuterol (VENTOLIN HFA) 108 (90 Base)  MCG/ACT inhaler Inhale 2 puffs into the lungs every 6 (six) hours as needed for wheezing or shortness of breath. 01/20/22   White, Leitha Schuller, NP  benzonatate (TESSALON) 100 MG capsule Take 1 capsule (100 mg total) by mouth every 8 (eight) hours. 01/20/22   White, Leitha Schuller, NP  omeprazole (PRILOSEC) 40 MG capsule Take 1 capsule (40 mg total) by mouth daily. 06/17/15   Milus Banister, MD  predniSONE (DELTASONE) 20 MG tablet Take 2 tablets (40 mg total) by mouth daily. 01/20/22   White, Leitha Schuller, NP  promethazine-dextromethorphan (PROMETHAZINE-DM) 6.25-15 MG/5ML syrup Take 5 mLs by mouth 4 (four) times daily as needed for cough. 01/20/22   Hans Eden, NP    Family History Family History  Problem Relation Age of Onset   Breast cancer Mother 1       very early first stage BRACA negative   Stroke Mother    Prostate cancer  Father    Diabetes Father     Social History Social History   Tobacco Use   Smoking status: Former   Smokeless tobacco: Never  Substance Use Topics   Alcohol use: Yes    Alcohol/week: 0.0 standard drinks of alcohol    Comment: 1-2 a month   Drug use: No     Allergies   Patient has no known allergies.   Review of Systems Review of Systems  Constitutional: Negative.   HENT: Negative.    Respiratory:  Positive for cough and wheezing.   Cardiovascular: Negative.   Gastrointestinal:  Positive for nausea.  Genitourinary: Negative.   Musculoskeletal: Negative.   Neurological:  Positive for headaches.  Hematological: Negative.   Psychiatric/Behavioral: Negative.       Physical Exam Triage Vital Signs ED Triage Vitals  Enc Vitals Group     BP 01/22/22 0811 (!) 170/73     Pulse Rate 01/22/22 0811 81     Resp 01/22/22 0811 12     Temp 01/22/22 0811 98.8 F (37.1 C)     Temp Source 01/22/22 0811 Oral     SpO2 01/22/22 0811 98 %     Weight --      Height --      Head Circumference --      Peak Flow --      Pain Score 01/22/22 0809 4     Pain  Loc --      Pain Edu? --      Excl. in GC? --    No data found.  Updated Vital Signs BP (!) 170/73 (BP Location: Left Arm)   Pulse 81   Temp 98.8 F (37.1 C) (Oral)   Resp 12   LMP 04/19/2002   SpO2 98%   Visual Acuity Right Eye Distance:   Left Eye Distance:   Bilateral Distance:    Right Eye Near:   Left Eye Near:    Bilateral Near:     Physical Exam Constitutional:      Appearance: Normal appearance.  HENT:     Head: Normocephalic.  Cardiovascular:     Rate and Rhythm: Normal rate and regular rhythm.  Pulmonary:     Effort: Pulmonary effort is normal.     Breath sounds: Wheezing present.     Comments: At the right mid lung field, and bilateral bases Musculoskeletal:     Cervical back: Normal range of motion and neck supple.  Skin:    General: Skin is warm.  Neurological:     General: No focal deficit present.     Mental Status: She is alert and oriented to person, place, and time.     Sensory: No sensory deficit.     Motor: No weakness.     Coordination: Coordination normal.     Gait: Gait normal.     Deep Tendon Reflexes: Reflexes normal.  Psychiatric:        Mood and Affect: Mood normal.      UC Treatments / Results  Labs (all labs ordered are listed, but only abnormal results are displayed) Labs Reviewed - No data to display  EKG   Radiology DG Chest 2 View  Result Date: 01/22/2022 CLINICAL DATA:  Provided history: Cough, wheezing. EXAM: CHEST - 2 VIEW COMPARISON:  Prior chest radiographs 02/29/2020 and earlier FINDINGS: Heart size within normal limits. Subtle ill-defined opacities within the anterior left lung base, which may reflect atelectasis or early pneumonia. No appreciable airspace consolidation on the right.  No evidence of pleural effusion or pneumothorax. No acute bony abnormality identified. These results will be called to the ordering clinician or representative by the Radiologist Assistant, and communication documented in the PACS or  [employer]. IMPRESSION: Subtle ill-defined opacities within the anterior left lung base, which may reflect atelectasis or early pneumonia. Correlate clinically and consider short-interval radiographic follow-up. Electronically Signed   By: Jackey Loge D.O.   On: 01/22/2022 08:46    Procedures Procedures (including critical care time)  Medications Ordered in UC Medications - No data to display  Initial Impression / Assessment and Plan / UC Course  I have reviewed the triage vital signs and the nursing notes.  Pertinent labs & imaging results that were available during my care of the patient were reviewed by me and considered in my medical decision making (see chart for details).    Final Clinical Impressions(s) / UC Diagnoses   Final diagnoses:  Essential hypertension  Wheezing  Acute cough  Nonintractable headache, unspecified chronicity pattern, unspecified headache type  Pneumonia of left lower lobe due to infectious organism     Discharge Instructions      You were seen today for elevated blood pressure for several days.  I have sent out amlodipine to take once/day for this.  Please monitor your blood pressure over the next several days.  If your blood pressure is not improving, or you have worsening headache, blurry vision or dizziness, please go to the ER for evaluation.   Please stop the prednisone, and decreased salt in your diet.  Please follow up with your primary care provider for follow up of your blood pressure.  Your chest xray shows possible pneumonia in the left lower lung.  I have sent out an antibiotic for you today for this.   If you have worsening cough, or develop fever or shortness of breath then please go to the ER. Again, please follow up with your primary care as you may need to a repeat xray to make sure this resolves.     ED Prescriptions     Medication Sig Dispense Auth. Provider   amLODipine (NORVASC) 5 MG tablet Take 1 tablet (5 mg total) by  mouth daily. 30 tablet Eri Mcevers, MD   azithromycin (ZITHROMAX) 250 MG tablet Take 1 tablet (250 mg total) by mouth daily. Take first 2 tablets together, then 1 every day until finished. 6 tablet Jannifer Franklin, MD      PDMP not reviewed this encounter.   Jannifer Franklin, MD 01/22/22 [phone]

## 2022-02-02 ENCOUNTER — Encounter: Payer: Self-pay | Admitting: Gastroenterology

## 2022-02-02 ENCOUNTER — Other Ambulatory Visit (INDEPENDENT_AMBULATORY_CARE_PROVIDER_SITE_OTHER): Payer: 59

## 2022-02-02 ENCOUNTER — Ambulatory Visit: Payer: 59 | Admitting: Gastroenterology

## 2022-02-02 VITALS — BP 138/80 | HR 88 | Ht 59.0 in | Wt 156.0 lb

## 2022-02-02 DIAGNOSIS — K219 Gastro-esophageal reflux disease without esophagitis: Secondary | ICD-10-CM | POA: Diagnosis not present

## 2022-02-02 DIAGNOSIS — R933 Abnormal findings on diagnostic imaging of other parts of digestive tract: Secondary | ICD-10-CM

## 2022-02-02 DIAGNOSIS — R1310 Dysphagia, unspecified: Secondary | ICD-10-CM

## 2022-02-02 DIAGNOSIS — Z8719 Personal history of other diseases of the digestive system: Secondary | ICD-10-CM

## 2022-02-02 DIAGNOSIS — R112 Nausea with vomiting, unspecified: Secondary | ICD-10-CM

## 2022-02-02 DIAGNOSIS — R197 Diarrhea, unspecified: Secondary | ICD-10-CM

## 2022-02-02 LAB — COMPREHENSIVE METABOLIC PANEL
ALT: 13 U/L (ref 0–35)
AST: 14 U/L (ref 0–37)
Albumin: 4.2 g/dL (ref 3.5–5.2)
Alkaline Phosphatase: 70 U/L (ref 39–117)
BUN: 12 mg/dL (ref 6–23)
CO2: 29 mEq/L (ref 19–32)
Calcium: 9.4 mg/dL (ref 8.4–10.5)
Chloride: 105 mEq/L (ref 96–112)
Creatinine, Ser: 0.74 mg/dL (ref 0.40–1.20)
GFR: 85.54 mL/min (ref >60.00)
Glucose, Bld: 91 mg/dL (ref 70–99)
Potassium: 4.2 mEq/L (ref 3.5–5.1)
Sodium: 141 mEq/L (ref 135–145)
Total Bilirubin: 0.3 mg/dL (ref 0.2–1.2)
Total Protein: 7.6 g/dL (ref 6.0–8.3)

## 2022-02-02 LAB — CBC WITH DIFFERENTIAL/PLATELET
Basophils Absolute: 0 10*3/uL (ref 0.0–0.1)
Basophils Relative: 0.6 % (ref 0.0–3.0)
Eosinophils Absolute: 0.4 10*3/uL (ref 0.0–0.7)
Eosinophils Relative: 4.7 % (ref 0.0–5.0)
HCT: 43.9 % (ref 36.0–46.0)
Hemoglobin: 14.9 g/dL (ref 12.0–15.0)
Lymphocytes Relative: 36.9 % (ref 12.0–46.0)
Lymphs Abs: 2.9 10*3/uL (ref 0.7–4.0)
MCHC: 34 g/dL (ref 30.0–36.0)
MCV: 93.8 fl (ref 78.0–100.0)
Monocytes Absolute: 0.6 10*3/uL (ref 0.1–1.0)
Monocytes Relative: 7.5 % (ref 3.0–12.0)
Neutro Abs: 3.9 10*3/uL (ref 1.4–7.7)
Neutrophils Relative %: 50.3 % (ref 43.0–77.0)
Platelets: 219 10*3/uL (ref 150.0–400.0)
RBC: 4.68 Mil/uL (ref 3.87–5.11)
RDW: 15.3 % (ref 11.5–15.5)
WBC: 7.8 10*3/uL (ref 4.0–10.5)

## 2022-02-02 LAB — SEDIMENTATION RATE: Sed Rate: 38 mm/hr — ABNORMAL HIGH (ref 0–30)

## 2022-02-02 LAB — HIGH SENSITIVITY CRP: CRP, High Sensitivity: 6.01 mg/L — ABNORMAL HIGH (ref 0.000–5.000)

## 2022-02-02 LAB — TSH: TSH: 0.81 u[IU]/mL (ref 0.35–5.50)

## 2022-02-02 MED ORDER — NA SULFATE-K SULFATE-MG SULF 17.5-3.13-1.6 GM/177ML PO SOLN: 1.0000 | Freq: Once | ORAL | 0 refills | Status: AC

## 2022-02-02 MED ORDER — OMEPRAZOLE 40 MG PO CPDR: 40.0000 mg | DELAYED_RELEASE_CAPSULE | Freq: Two times a day (BID) | ORAL | 3 refills | Status: DC

## 2022-02-02 NOTE — Patient Instructions (Signed)
_______________________________________________________  If you are age 64 or older, your body mass index should be between 23-30. Your Body mass index is 31.51 kg/m. If this is out of the aforementioned range listed, please consider follow up with your Primary Care Provider.  If you are age 71 or younger, your body mass index should be between 19-25. Your Body mass index is 31.51 kg/m. If this is out of the aformentioned range listed, please consider follow up with your Primary Care Provider.   ________________________________________________________  The [facility] providers would like to encourage you to use [facility] to communicate with providers for non-urgent requests or questions.  Due to long hold times on the telephone, sending your provider a message by [facility] may be a faster and more efficient way to get a response.  Please allow 48 business hours for a response.  Please remember that this is for non-urgent requests.  _______________________________________________________  Your provider has requested that you go to the basement level for lab work before leaving today. Press "B" on the elevator. The lab is located at the first door on the left as you exit the elevator.  You have been scheduled for an endoscopy and colonoscopy. Please follow the written instructions given to you at your visit today. Please pick up your prep supplies at the pharmacy within the next 1-3 days. If you use inhalers (even only as needed), please bring them with you on the day of your procedure.  Due to recent changes in healthcare laws, you may see the results of your imaging and laboratory studies on MyChart before your provider has had a chance to review them.  We understand that in some cases there may be results that are confusing or concerning to you. Not all laboratory results come back in the same time frame and the provider may be waiting for multiple results in order to interpret others.  Please give  [place] 48 hours in order for your provider to thoroughly review all the results before contacting the office for clarification of your results.   It was a pleasure to see you today!  Thank you for trusting me with your gastrointestinal care!

## 2022-02-02 NOTE — Progress Notes (Signed)
02/02/2022 Christine Shannon [id number] [birth year]   HISTORY OF PRESENT ILLNESS: This is a 66 year old female who is a patient of [name].  She was only seen by him once in January 2017 and then had an EGD as below in February 2017.  She is here today with her daughter with a couple of different complaints.  First, she complains of dysphagia.  Very frequently food gets stuck while eating.  She says that she has just learned to live with it.  Says that her initial dilation in 2016 with [name] did not seem to help, but then she had repeat dilation with [name] in February 2017 and that did seem to help for a while.  She was on omeprazole, but has been off of that as of late.  She also reports episodes about once a month or so over the past 6 months where she will wake up in the morning with sensation that she needs to move her bowels and that will be followed by excessive salivating with nausea and then followed by vomiting and diarrhea/loose stools at the same time.  She has that this will last for short time, maybe 30 minutes or so and then slowly dissipates and resolves and she feels fine the rest the day.  Has, for the most part, regular bowel movements otherwise with an occasional episode of diarrhea in the middle of the day.  Intermittent bright red rectal bleeding that thought was related to occasional NSAID use and her hemorrhoids.  No abdominal pain per se.  She says that when the vomiting occurs it appears like bile.  The last episode in September was probably the worst with a large amount of vomiting.   EGD 05/2015:  There was a smooth, fairly focal stricture at GE junction. The lumen was 72mm across, mucosa in that region (distal esophagus) was somewhat granular appearing but not neoplastic. The stricture was slightly dilated with scope passage and then I used an 31mm TTS balloon to dilate it further. There was the usual superficial mucosal tear and self limited oozing of blood  following dilation. The esophagus was biopsied (distal and proximal) and sent to pathology. The examination was otherwise normal  1. 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 - PAS STAIN NEGATIVE FOR FUNGUS. - NO DYSPLASIA OR MALIGNANCY. 2. 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 - PAS STAIN NEGATIVE FOR FUNGUS. - NO DYSPLASIA OR MALIGNANCY.  11/2014 Colonoscopy [name] (done for rectal bleeding): no polyps, internal hemorrhoids, a few erosions noted in the terminal ileum: recommended repeat colonoscopy ni 10 years for screening 11/2014 EGD [name]: "Mild esophageal stenosis at the GE junction, dilated with the passage of the scope". Normal otherwise examination. She was recommended to return to the office when necessary  Past Medical History:  Diagnosis Date   Anxiety    Depression    Endometriosis    Infertility, female    Regurgitation    of heart   Urinary incontinence    Past Surgical History:  Procedure Laterality Date   CESAREAN SECTION     2 c-sections   cyst removed     from under left armpit   ESOPHAGOGASTRODUODENOSCOPY     Dr Collene Mares   EXPLORATORY LAPAROTOMY      reports that she has quit smoking. She has never used smokeless tobacco. She reports current alcohol use. She reports that she does not use drugs. family  history includes Breast cancer (age of onset: 86) in her mother; Diabetes in her father; Prostate cancer in her father; Stroke in her mother. No Known Allergies    Outpatient Encounter Medications as of 02/02/2022  Medication Sig   albuterol (VENTOLIN HFA) 108 (90 Base) MCG/ACT inhaler Inhale 2 puffs into the lungs every 6 (six) hours as needed for wheezing or shortness of breath. (Patient not taking: Reported on 02/02/2022)   amLODipine (NORVASC) 5 MG tablet Take 1 tablet (5 mg total) by mouth daily. (Patient not taking: Reported on  02/02/2022)   azithromycin (ZITHROMAX) 250 MG tablet Take 1 tablet (250 mg total) by mouth daily. Take first 2 tablets together, then 1 every day until finished. (Patient not taking: Reported on 02/02/2022)   benzonatate (TESSALON) 100 MG capsule Take 1 capsule (100 mg total) by mouth every 8 (eight) hours. (Patient not taking: Reported on 02/02/2022)   omeprazole (PRILOSEC) 40 MG capsule Take 1 capsule (40 mg total) by mouth daily. (Patient not taking: Reported on 02/02/2022)   promethazine-dextromethorphan (PROMETHAZINE-DM) 6.25-15 MG/5ML syrup Take 5 mLs by mouth 4 (four) times daily as needed for cough. (Patient not taking: Reported on 02/02/2022)   No facility-administered encounter medications on file as of 02/02/2022.     REVIEW OF SYSTEMS  : All other systems reviewed and negative except where noted in the History of Present Illness.   PHYSICAL EXAM: Ht 4\' 11"  (1.499 m)   Wt 156 lb (70.8 kg)   LMP 04/19/2002   BMI 31.51 kg/m  General: Well developed female in no acute distress Head: Normocephalic and atraumatic Eyes:  Sclerae anicteric, conjunctiva pink. Ears: Normal auditory acuity Lungs: Clear throughout to auscultation; no W/R/R. Heart: Regular rate and rhythm; no M/R/G. Abdomen: Soft, non-distended. BS present.  Non-tender. Rectal:  Will be done at the time of colonoscopy. Musculoskeletal: Symmetrical with no gross deformities  Skin: No lesions on visible extremities Extremities: No edema  Neurological: Alert oriented x 4, grossly non-focal Psychological:  Alert and cooperative. Normal mood and affect  ASSESSMENT AND PLAN: *66 year old female with complaints of dysphagia.  She does have a history of esophageal stricture that was dilated, but also had a question of possible EOE back in 2017.  She was supposed to remain on high-dose PPI therapy and then have a repeat EGD with further dilation and repeat biopsies.  She never returned for that.  Now she has been off of PPI  for a while.  I am going to place her back on omeprazole 40 mg twice daily.  Prescription sent to pharmacy.  We will then plan for repeat EGD with repeat biopsies and esophageal dilation in about 4 to 6 weeks.  This has being scheduled with Dr. Candis Schatz. *Episodes of vomiting and diarrhea: Randomly has sudden onset of sensation of needing to move her bowels that is then followed by mouth salivation with bilious vomiting and diarrhea.  Episodes last a short time, less than an hour, have occurred maybe once a month the past 6 months.  No abdominal pain per se.  Not really sure what is causing these episodes.  Thought about gallbladder, but not really having any pain per se.  She did have some terminal ileal erosions seen on her colonoscopy with [name] in 2016, but I do not see any biopsies.  We will go ahead and plan for repeat colonoscopy with Dr. Candis Schatz as well to reassess.  She has had no recent labs.  We will plan for CBC, CMP,  TSH, sed rate, CRP, and celiac labs.  The risks, benefits, and alternatives to EGD with dilation and colonoscopy were discussed with the patient and she consents to proceed.    CC:  Lin Landsman, MD

## 2022-02-03 LAB — IGA: Immunoglobulin A: 430 mg/dL — ABNORMAL HIGH (ref 70–320)

## 2022-02-03 LAB — TISSUE TRANSGLUTAMINASE, IGA: (tTG) Ab, IgA: <1 U/mL

## 2022-02-04 NOTE — Progress Notes (Signed)
Agree with the assessment and plan as outlined by Alonza Bogus, PA-C.  Reasonable to reassess for Crohn's disease given history of ileal ulcers and new onset abdominal pain nausea, vomiting/diarrhea.  If EGD/colonoscopy unrevealing, and symptoms persist, then would assess for gallstones/biliary dyskinesia.  Avelyn Touch E. Candis Schatz, MD  [facility]

## 2022-03-15 ENCOUNTER — Encounter: Payer: Self-pay | Admitting: Gastroenterology

## 2022-03-15 ENCOUNTER — Telehealth: Payer: Self-pay | Admitting: Gastroenterology

## 2022-03-15 NOTE — Telephone Encounter (Signed)
Patient is calling in regards to her suprep states it is too expensive and is looking for cheaper option. Please advise

## 2022-03-16 NOTE — Telephone Encounter (Signed)
Inbound call from patient in regards to prep. Please advise  Patient calling to see if there is an alternative prep due to original prep cost being $80.00.  Thank you

## 2022-03-17 ENCOUNTER — Other Ambulatory Visit: Payer: Self-pay

## 2022-03-17 MED ORDER — PEG 3350-KCL-NA BICARB-NACL 420 G PO SOLR: 4000.0000 mL | Freq: Once | ORAL | 0 refills | Status: AC

## 2022-03-17 NOTE — Telephone Encounter (Signed)
Patient has been notified and aware of new instructions and new prep sent to the pharmacy.

## 2022-03-17 NOTE — Telephone Encounter (Signed)
Patient called to follow up on message below about the cost of her prep medication. Please advise.

## 2022-03-24 ENCOUNTER — Ambulatory Visit (AMBULATORY_SURGERY_CENTER): Payer: 59 | Admitting: Gastroenterology

## 2022-03-24 ENCOUNTER — Encounter: Payer: Self-pay | Admitting: Gastroenterology

## 2022-03-24 VITALS — BP 161/76 | HR 65 | Temp 97.3°F | Resp 18 | Ht 59.0 in | Wt 156.0 lb

## 2022-03-24 DIAGNOSIS — R197 Diarrhea, unspecified: Secondary | ICD-10-CM

## 2022-03-24 DIAGNOSIS — K222 Esophageal obstruction: Secondary | ICD-10-CM | POA: Diagnosis not present

## 2022-03-24 DIAGNOSIS — K5289 Other specified noninfective gastroenteritis and colitis: Secondary | ICD-10-CM

## 2022-03-24 DIAGNOSIS — K2100 Gastro-esophageal reflux disease with esophagitis, without bleeding: Secondary | ICD-10-CM

## 2022-03-24 DIAGNOSIS — K449 Diaphragmatic hernia without obstruction or gangrene: Secondary | ICD-10-CM | POA: Diagnosis not present

## 2022-03-24 DIAGNOSIS — R1310 Dysphagia, unspecified: Secondary | ICD-10-CM | POA: Diagnosis present

## 2022-03-24 DIAGNOSIS — K229 Disease of esophagus, unspecified: Secondary | ICD-10-CM | POA: Diagnosis not present

## 2022-03-24 MED ORDER — SODIUM CHLORIDE 0.9 % IV SOLN: 500.0000 mL | INTRAVENOUS | Status: AC

## 2022-03-24 NOTE — Op Note (Signed)
[facility] Patient Name: Christine Shannon Procedure Date: 03/24/2022 11:37 AM MRN: [id number] Endoscopist: Lorin Picket E. Tomasa Rand , MD, [id number] Age: 66 Referring MD:  Date of Birth: 13-Jun-1955 Gender: Female Account #: [id number] Procedure:                Upper GI endoscopy Indications:              Dysphagia Medicines:                Monitored Anesthesia Care Procedure:                Pre-Anesthesia Assessment:                           - Prior to the procedure, a History and Physical                            was performed, and patient medications and                            allergies were reviewed. The patient's tolerance of                            previous anesthesia was also reviewed. The risks                            and benefits of the procedure and the sedation                            options and risks were discussed with the patient.                            All questions were answered, and informed consent                            was obtained. Prior Anticoagulants: The patient has                            taken no anticoagulant or antiplatelet agents. ASA                            Grade Assessment: II - A patient with mild systemic                            disease. After reviewing the risks and benefits,                            the patient was deemed in satisfactory condition to                            undergo the procedure.                           After obtaining informed consent, the endoscope was  passed under direct vision. Throughout the                            procedure, the patient's blood pressure, pulse, and                            oxygen saturations were monitored continuously. The                            GIF [id number] #[id number] was introduced through the                            mouth, and advanced to the third part of duodenum.                            The upper GI endoscopy was accomplished  without                            difficulty. The patient tolerated the procedure                            well. Scope In: Scope Out: Findings:                 The examined portions of the nasopharynx,                            oropharynx and larynx were normal.                           One benign-appearing, intrinsic moderate stenosis                            was found in the distal esophagus. This stenosis                            measured 9 mm (inner diameter) x 1 cm (in length).                            The stenosis was traversed. A TTS dilator was                            passed through the scope. Dilation with an 02-29-12                            mm balloon dilator was performed to 12 mm. The                            dilation site was examined following endoscope                            reinsertion and showed mild mucosal disruption.  Estimated blood loss was minimal.                           The exam of the esophagus was otherwise normal.                           Biopsies were obtained from the proximal and distal                            esophagus with cold forceps for histology of                            suspected eosinophilic esophagitis. Estimated blood                            loss was minimal.                           A 3 cm hiatal hernia was present.                           The entire examined stomach was normal.                           The examined duodenum was normal. Complications:            No immediate complications. Estimated Blood Loss:     Estimated blood loss was minimal. Impression:               - The examined portions of the nasopharynx,                            oropharynx and larynx were normal.                           - Benign-appearing esophageal stenosis. Dilated.                           - 3 cm hiatal hernia.                           - Normal stomach.                           - Normal  examined duodenum.                           - Biopsies were taken with a cold forceps for                            evaluation of eosinophilic esophagitis. Recommendation:           - Patient has a contact number available for                            emergencies. The signs and symptoms of potential  delayed complications were discussed with the                            patient. Return to normal activities tomorrow.                            Written discharge instructions were provided to the                            patient.                           - Resume previous diet.                           - Continue present medications.                           - Await pathology results.                           - Continue taking omeprazole daily. Further                            recommendations will be based on pathology results. Eesa Justiss E. Tomasa Rand, MD 03/24/2022 12:20:29 PM This report has been signed electronically.

## 2022-03-24 NOTE — Progress Notes (Signed)
[facility] History and Physical   Primary Care Physician:  Leilani Able, MD   Reason for Procedure:   Dysphagia, nausea/vomiting, diarrhea and history of ileal erosions  Plan:    EGD with dilation, colonoscopy     HPI: Christine Shannon is a 66 y.o. female with a history of chronic dysphagia and esophageal stenosis previously dilated, also with elevated eosinophils suggestive of EoE, was off PPI for several years, recently restarted.  She also has been having episodes of hypersalivation with nausea and bilious vomiting and sudden urge to defecate.  She has a history of ileal erosions seen on her colonoscopy in 2016.  EGD for dilation and repeat biopsies for EoE.  Colonoscopy to exclude Crohn's disease as cause of diarrhea/n/v given history of ileal erosions. Her symptoms have improved since restarting omeprazole.   Past Medical History:  Diagnosis Date   Anxiety    Depression    Endometriosis    Infertility, female    Pneumonia 01/2022   Regurgitation    of heart   Urinary incontinence     Past Surgical History:  Procedure Laterality Date   CESAREAN SECTION     2 c-sections   cyst removed     from under left armpit   ESOPHAGOGASTRODUODENOSCOPY     Dr Loreta Ave   EXPLORATORY LAPAROTOMY      Prior to Admission medications   Medication Sig Start Date End Date Taking? Authorizing Provider  albuterol (VENTOLIN HFA) 108 (90 Base) MCG/ACT inhaler Inhale 2 puffs into the lungs every 6 (six) hours as needed for wheezing or shortness of breath. Patient not taking: Reported on 02/02/2022 01/20/22   Valinda Hoar, NP  amLODipine (NORVASC) 5 MG tablet Take 1 tablet (5 mg total) by mouth daily. Patient not taking: Reported on 02/02/2022 01/22/22   Jannifer Franklin, MD  omeprazole (PRILOSEC) 40 MG capsule Take 1 capsule (40 mg total) by mouth 2 (two) times daily before a meal. Patient not taking: Reported on 03/24/2022 02/02/22   Zehr, Princella Pellegrini, PA-C    Current Outpatient  Medications  Medication Sig Dispense Refill   albuterol (VENTOLIN HFA) 108 (90 Base) MCG/ACT inhaler Inhale 2 puffs into the lungs every 6 (six) hours as needed for wheezing or shortness of breath. (Patient not taking: Reported on 02/02/2022) 8 g 2   amLODipine (NORVASC) 5 MG tablet Take 1 tablet (5 mg total) by mouth daily. (Patient not taking: Reported on 02/02/2022) 30 tablet 0   omeprazole (PRILOSEC) 40 MG capsule Take 1 capsule (40 mg total) by mouth 2 (two) times daily before a meal. (Patient not taking: Reported on 03/24/2022) 60 capsule 3   Current Facility-Administered Medications  Medication Dose Route Frequency Provider Last Rate Last Admin   0.9 %  sodium chloride infusion  500 mL Intravenous Continuous Jenel Lucks, MD        Allergies as of 03/24/2022   (No Known Allergies)    Family History  Problem Relation Age of Onset   Breast cancer Mother 42       very early first stage BRACA negative   Stroke Mother    Prostate cancer Father    Diabetes Father    Stomach cancer Neg Hx    Colon cancer Neg Hx    Esophageal cancer Neg Hx    Pancreatic cancer Neg Hx     Social History   Socioeconomic History   Marital status: Single    Spouse name: Not on file   Number  of children: Not on file   Years of education: Not on file   Highest education level: Not on file  Occupational History   Not on file  Tobacco Use   Smoking status: Former   Smokeless tobacco: Never  Vaping Use   Vaping Use: Never used  Substance and Sexual Activity   Alcohol use: Yes    Alcohol/week: 0.0 standard drinks of alcohol    Comment: 1-2 a month   Drug use: No   Sexual activity: Not Currently    Partners: Female, Female    Birth control/protection: Post-menopausal  Other Topics Concern   Not on file  Social History Narrative   Not on file   Social Determinants of Health   Financial Resource Strain: Not on file  Food Insecurity: Not on file  Transportation Needs: Not on file   Physical Activity: Not on file  Stress: Not on file  Social Connections: Not on file  Intimate Partner Violence: Not on file    Review of Systems:  All other review of systems negative except as mentioned in the HPI.  Physical Exam: Vital signs BP 139/73   Pulse 70   Temp (!) 97.3 F (36.3 C)   Ht 4\' 11"  (1.499 m)   Wt 156 lb (70.8 kg)   LMP 04/19/2002   SpO2 98%   BMI 31.51 kg/m   General:   Alert,  Well-developed, well-nourished, pleasant and cooperative in NAD Airway:  Mallampati 1 Lungs:  Clear throughout to auscultation.   Heart:  Regular rate and rhythm; no murmurs, clicks, rubs,  or gallops. Abdomen:  Soft, nontender and nondistended. Normal bowel sounds.   Neuro/Psych:  Normal mood and affect. A and O x 3   Ayan Yankey E. 06/18/2002, MD [facility]

## 2022-03-24 NOTE — Patient Instructions (Signed)
Thank you for coming in today! Resume previous diet and medications today. Return to regular daily activities tomorrow. Biopsy results will be available in 1-2 weeks.   YOU HAD AN ENDOSCOPIC PROCEDURE TODAY AT THE [facility]:   Refer to the procedure report that was given to you for any specific questions about what was found during the examination.  If the procedure report does not answer your questions, please call your gastroenterologist to clarify.  If you requested that your care partner not be given the details of your procedure findings, then the procedure report has been included in a sealed envelope for you to review at your convenience later.  YOU SHOULD EXPECT: Some feelings of bloating in the abdomen. Passage of more gas than usual.  Walking can help get rid of the air that was put into your GI tract during the procedure and reduce the bloating. If you had a lower endoscopy (such as a colonoscopy or flexible sigmoidoscopy) you may notice spotting of blood in your stool or on the toilet paper. If you underwent a bowel prep for your procedure, you may not have a normal bowel movement for a few days.  Please Note:  You might notice some irritation and congestion in your nose or some drainage.  This is from the oxygen used during your procedure.  There is no need for concern and it should clear up in a day or so.  SYMPTOMS TO REPORT IMMEDIATELY:  Following lower endoscopy (colonoscopy or flexible sigmoidoscopy):  Excessive amounts of blood in the stool  Significant tenderness or worsening of abdominal pains  Swelling of the abdomen that is new, acute  Fever of 100F or higher  Following upper endoscopy (EGD)  Vomiting of blood or coffee ground material  New chest pain or pain under the shoulder blades  Painful or persistently difficult swallowing  New shortness of breath  Fever of 100F or higher  Black, tarry-looking stools  For urgent or emergent issues, a  gastroenterologist can be reached at any hour by calling [phone]. Do not use MyChart messaging for urgent concerns.    DIET:  We do recommend a small meal at first, but then you may proceed to your regular diet.  Drink plenty of fluids but you should avoid alcoholic beverages for 24 hours.  ACTIVITY:  You should plan to take it easy for the rest of today and you should NOT DRIVE or use heavy machinery until tomorrow (because of the sedation medicines used during the test).    FOLLOW UP: Our staff will call the number listed on your records the next business day following your procedure.  We will call around 7:15- 8:00 am to check on you and address any questions or concerns that you may have regarding the information given to you following your procedure. If we do not reach you, we will leave a message.     If any biopsies were taken you will be contacted by phone or by letter within the next 1-3 weeks.  Please call us at [phone] if you have not heard about the biopsies in 3 weeks.    SIGNATURES/CONFIDENTIALITY: You and/or your care partner have signed paperwork which will be entered into your electronic medical record.  These signatures attest to the fact that that the information above on your After Visit Summary has been reviewed and is understood.  Full responsibility of the confidentiality of this discharge information lies with you and/or your care-partner.

## 2022-03-24 NOTE — Progress Notes (Signed)
Sedate, gd SR, tolerated procedure well, VSS, report to RN 

## 2022-03-24 NOTE — Progress Notes (Signed)
Patient denies change in health or medications since office visit.

## 2022-03-24 NOTE — Progress Notes (Signed)
Called to room to assist during endoscopic procedure.  Patient ID and intended procedure confirmed with present staff. Received instructions for my participation in the procedure from the performing physician.  

## 2022-03-24 NOTE — Op Note (Signed)
[facility] Patient Name: Christine Shannon Procedure Date: 03/24/2022 11:36 AM MRN: [id number] Endoscopist: Lorin Picket E. Tomasa Rand , MD, [id number] Age: 66 Referring MD:  Date of Birth: [birth year] Gender: Female Account #: [id number] Procedure:                Colonoscopy Indications:              Diarrhea, Exclusion of Crohn's disease of the small                            bowel: Patient found to have ileal erosions on                            screening colonoscopy in 2016. Lately has been                            having episodic nausea/vomiting and diarrhea,                            improved when she restarted PPI. No abdominal pain. Medicines:                Monitored Anesthesia Care Procedure:                Pre-Anesthesia Assessment:                           - Prior to the procedure, a History and Physical                            was performed, and patient medications and                            allergies were reviewed. The patient's tolerance of                            previous anesthesia was also reviewed. The risks                            and benefits of the procedure and the sedation                            options and risks were discussed with the patient.                            All questions were answered, and informed consent                            was obtained. Prior Anticoagulants: The patient has                            taken no anticoagulant or antiplatelet agents. ASA                            Grade Assessment: II - A patient with mild systemic  disease. After reviewing the risks and benefits,                            the patient was deemed in satisfactory condition to                            undergo the procedure.                           After obtaining informed consent, the colonoscope                            was passed under direct vision. Throughout the                            procedure, the  patient's blood pressure, pulse, and                            oxygen saturations were monitored continuously. The                            PCF-HQ190L Colonoscope was introduced through the                            anus and advanced to the 10 cm into the ileum. The                            colonoscopy was performed without difficulty. The                            patient tolerated the procedure well. The quality                            of the bowel preparation was good. The terminal                            ileum, ileocecal valve, appendiceal orifice, and                            rectum were photographed. The bowel preparation                            used was GoLYTELY via split dose instruction. Scope In: 11:55:08 AM Scope Out: 12:12:18 PM Scope Withdrawal Time: 0 hours 13 minutes 6 seconds  Total Procedure Duration: 0 hours 17 minutes 10 seconds  Findings:                 The perianal and digital rectal examinations were                            normal. Pertinent negatives include normal                            sphincter tone and no palpable rectal lesions.  A few small-mouthed diverticula were found in the                            sigmoid colon.                           The exam was otherwise normal throughout the                            examined colon.                           The terminal ileum contained a few (2-3) discrete                            ulcers. No bleeding was present. No stigmata of                            recent bleeding were seen. Biopsies were taken with                            a cold forceps for histology. Estimated blood loss                            was minimal.                           The remainder of the exam in the terminal ileum was                            normal without erythema                           The retroflexed view of the distal rectum and anal                            verge  was normal and showed no anal or rectal                            abnormalities. Complications:            No immediate complications. Estimated Blood Loss:     Estimated blood loss was minimal. Impression:               - Diverticulosis in the sigmoid colon.                           - A few ulcers in the terminal ileum. Biopsied.                            These seem more consistent with NSAID enteropathy                            than Crohn's disease.                           -  The distal rectum and anal verge are normal on                            retroflexion view. Recommendation:           - Patient has a contact number available for                            emergencies. The signs and symptoms of potential                            delayed complications were discussed with the                            patient. Return to normal activities tomorrow.                            Written discharge instructions were provided to the                            patient.                           - Resume previous diet.                           - Continue present medications.                           - Await pathology results.                           - Repeat colonoscopy in 10 years for screening                            purposes.                           - Avoid NSAIDs                           - Further recommendations will be based on                            pathology results. Terrian Sentell E. Candis Schatz, MD 03/24/2022 12:26:29 PM This report has been signed electronically.

## 2022-03-25 ENCOUNTER — Telehealth: Payer: Self-pay | Admitting: *Deleted

## 2022-03-25 NOTE — Telephone Encounter (Signed)
  Follow up Call-     03/24/2022   10:38 AM  Call back number  Post procedure Call Back phone  # [phone]  Permission to leave phone message Yes     Patient questions:  Do you have a fever, pain , or abdominal swelling? No. Pain Score  0 *  Have you tolerated food without any problems? Yes.    Have you been able to return to your normal activities? Yes.    Do you have any questions about your discharge instructions: Diet   No. Medications  No. Follow up visit  No.  Do you have questions or concerns about your Care? No.  Actions: * If pain score is 4 or above: No action needed, pain <4.pt did say she had abd cramping ,was bloated and threw up bile during the night . Patient said she is much better this morning . Patient said she did not pass flatus in recovery room before she left (that she was aware of) . Encouraged pt to drink warm liquids and stay away from gas producing foods today and to call us back if she does not feel improved today as the day goes on.

## 2022-03-26 NOTE — Progress Notes (Signed)
Christine Shannon,  The biopsies of your esophagus did not show any changes consistent with eosinophilic esophagitis.  The biopsies of your distal esophagus showed some findings of reflux esophagitis.  The biopsies of your proximal esophagus were normal.  This is good news.  The narrowing in your esophagus can be attributed to acid reflux causing scarring and subsequent inflammation.  Please continue to take your Prilosec once a day indefinitely to reduce the risk of worsening of your esophageal stricture. As discussed, we can dilate your stricture further anytime after 4 weeks if you are still having difficulty swallowing.  The biopsies of the ulcers in your ileum did not show any evidence of Crohn's disease.  Most often this is due to medications such as NSAIDs (over-the-counter pain medicines).  Given that you do not take NSAIDs, the etiology of the small ulcers is not clear, but because of your absence of symptoms and the lack of progression in the ulcers seen from your previous colonoscopy, I would not recommend any further evaluation at this time. I recommend repeat colonoscopy in 10 years.

## 2022-05-17 ENCOUNTER — Encounter (HOSPITAL_COMMUNITY): Payer: Self-pay

## 2022-05-17 ENCOUNTER — Ambulatory Visit (HOSPITAL_COMMUNITY)
Admission: RE | Admit: 2022-05-17 | Discharge: 2022-05-17 | Disposition: A | Discharge status: Home / Self Care | Payer: 59 | Source: Ambulatory Visit | Attending: Internal Medicine | Admitting: Internal Medicine

## 2022-05-17 VITALS — BP 154/81 | HR 73 | Temp 98.2°F | Resp 18

## 2022-05-17 DIAGNOSIS — H10531 Contact blepharoconjunctivitis, right eye: Secondary | ICD-10-CM

## 2022-05-17 HISTORY — DX: Gastro-esophageal reflux disease without esophagitis: K21.9

## 2022-05-17 MED ORDER — ERYTHROMYCIN 5 MG/GM OP OINT: TOPICAL_OINTMENT | OPHTHALMIC | 0 refills | Status: DC

## 2022-05-17 MED ORDER — FLUORESCEIN SODIUM 1 MG OP STRP
ORAL_STRIP | OPHTHALMIC | Status: AC
  Filled 2022-05-17: qty 1

## 2022-05-17 NOTE — ED Triage Notes (Signed)
Pt c/o right eye pain, drainage, and feels like something in eye since yesterday. Pain is intermittent.

## 2022-05-17 NOTE — Discharge Instructions (Addendum)
Please apply medications as directed If you have worsening pain, swelling of the eyelid please return to urgent care to be reevaluated

## 2022-05-17 NOTE — ED Provider Notes (Signed)
[place]    CSN: [id number] Arrival date & time: 05/17/22  1251      History   Chief Complaint Chief Complaint  Patient presents with   appt 1   Eye Drainage    HPI Christine Shannon is a 67 y.o. female comes to the urgent care with 1 day history of right eye redness and increased tearing of the right eye.  Patient says symptoms started yesterday and has been persistent.  She has a sensation of foreign body in the right eye especially in the right lower lid.  No trauma to the eye.  No itchy eyes.  Patient denies rubbing her eyes.  No double vision, blurry vision or floaters in the visual field.  No headaches.  Patient denies any fever or chills.  No sick contacts.Marland Kitchen   HPI  Past Medical History:  Diagnosis Date   Anxiety    Depression    Endometriosis    GERD (gastroesophageal reflux disease)    Infertility, female    Pneumonia 01/2022   Regurgitation    of heart   Urinary incontinence     Patient Active Problem List   Diagnosis Date Noted   Dysphagia 02/02/2022   History of esophageal stricture 02/02/2022   Gastroesophageal reflux disease 02/02/2022   Nausea and vomiting 02/02/2022   Diarrhea 02/02/2022   Abnormal colonoscopy 02/02/2022    Past Surgical History:  Procedure Laterality Date   CESAREAN SECTION     2 c-sections   cyst removed     from under left armpit   ESOPHAGOGASTRODUODENOSCOPY     Dr Collene Mares   EXPLORATORY LAPAROTOMY      OB History     Gravida  2   Para  2   Term  2   Preterm      AB      Living  2      SAB      IAB      Ectopic      Multiple      Live Births  2            Home Medications    Prior to Admission medications   Medication Sig Start Date End Date Taking? Authorizing Provider  erythromycin ophthalmic ointment Place a 1/2 inch ribbon of ointment into the lower eyelid. 05/17/22  Yes Doretta Remmert, Myrene Galas, MD  omeprazole (PRILOSEC) 40 MG capsule Take 1 capsule (40 mg total) by mouth 2 (two) times  daily before a meal. 02/02/22  Yes Zehr, Laban Emperor, PA-C    Family History Family History  Problem Relation Age of Onset   Breast cancer Mother 87       very early first stage BRACA negative   Stroke Mother    Prostate cancer Father    Diabetes Father    Stomach cancer Neg Hx    Colon cancer Neg Hx    Esophageal cancer Neg Hx    Pancreatic cancer Neg Hx     Social History Social History   Tobacco Use   Smoking status: Former   Smokeless tobacco: Never  Scientific laboratory technician Use: Never used  Substance Use Topics   Alcohol use: Yes    Alcohol/week: 0.0 standard drinks of alcohol    Comment: 1-2 a month   Drug use: No     Allergies   Patient has no known allergies.   Review of Systems Review of Systems As per HPI  Physical Exam Triage Vital  Signs ED Triage Vitals  Enc Vitals Group     BP 05/17/22 1322 (!) 154/81     Pulse Rate 05/17/22 1322 73     Resp 05/17/22 1322 18     Temp 05/17/22 1322 98.2 F (36.8 C)     Temp Source 05/17/22 1322 Oral     SpO2 05/17/22 1322 96 %     Weight --      Height --      Head Circumference --      Peak Flow --      Pain Score 05/17/22 1321 0     Pain Loc --      Pain Edu? --      Excl. in [place]? --    No data found.  Updated Vital Signs BP (!) 154/81 (BP Location: Right Arm)   Pulse 73   Temp 98.2 F (36.8 C) (Oral)   Resp 18   LMP 04/19/2002   SpO2 96%   Visual Acuity Right Eye Distance:   Left Eye Distance:   Bilateral Distance:    Right Eye Near:   Left Eye Near:    Bilateral Near:     Physical Exam Vitals and nursing note reviewed.  Constitutional:      General: She is not in acute distress.    Appearance: She is not ill-appearing.  Eyes:     Extraocular Movements: Extraocular movements intact.     Pupils: Pupils are equal, round, and reactive to light.     Comments: Right conjunctival erythema.  Fluorescein stain is negative for corneal abrasion.  Pupils are equal and reactive to light and  accommodation.  Cardiovascular:     Rate and Rhythm: Normal rate and regular rhythm.  Musculoskeletal:        General: Normal range of motion.  Neurological:     Mental Status: She is alert.      UC Treatments / Results  Labs (all labs ordered are listed, but only abnormal results are displayed) Labs Reviewed - No data to display  EKG   Radiology No results found.  Procedures Procedures (including critical care time)  Medications Ordered in UC Medications - No data to display  Initial Impression / Assessment and Plan / UC Course  I have reviewed the triage vital signs and the nursing notes.  Pertinent labs & imaging results that were available during my care of the patient were reviewed by me and considered in my medical decision making (see chart for details).     1.  Conjunctivitis of the right eye: Fluorescein stain is negative No foreign body noted on lower eyelid eversion Erythromycin ointment has been prescribed If symptoms worsen patient is advised to return to urgent care to be reevaluated. Final Clinical Impressions(s) / UC Diagnoses   Final diagnoses:  Contact blepharoconjunctivitis of right eye     Discharge Instructions      Please apply medications as directed If you have worsening pain, swelling of the eyelid please return to urgent care to be reevaluated    ED Prescriptions     Medication Sig Dispense Auth. Provider   erythromycin ophthalmic ointment Place a 1/2 inch ribbon of ointment into the lower eyelid. 3.5 g [name], Myrene Galas, MD      PDMP not reviewed this encounter.   Chase Picket, MD 05/17/22 [phone]

## 2022-08-04 ENCOUNTER — Ambulatory Visit (HOSPITAL_BASED_OUTPATIENT_CLINIC_OR_DEPARTMENT_OTHER): Payer: 59 | Admitting: Pulmonary Disease

## 2022-08-04 ENCOUNTER — Telehealth (HOSPITAL_BASED_OUTPATIENT_CLINIC_OR_DEPARTMENT_OTHER): Payer: 59 | Admitting: Pulmonary Disease

## 2022-08-04 ENCOUNTER — Ambulatory Visit (INDEPENDENT_AMBULATORY_CARE_PROVIDER_SITE_OTHER): Payer: 59 | Admitting: Pulmonary Disease

## 2022-08-04 ENCOUNTER — Encounter (HOSPITAL_BASED_OUTPATIENT_CLINIC_OR_DEPARTMENT_OTHER): Payer: Self-pay | Admitting: Pulmonary Disease

## 2022-08-04 VITALS — BP 162/86 | HR 74 | Ht 59.0 in | Wt 157.2 lb

## 2022-08-04 DIAGNOSIS — R0602 Shortness of breath: Secondary | ICD-10-CM | POA: Diagnosis not present

## 2022-08-04 LAB — PULMONARY FUNCTION TEST
DL/VA % pred: 90 %
DL/VA: 3.92 ml/min/mmHg/L
DLCO cor % pred: 79 %
DLCO cor: 13.32 ml/min/mmHg
DLCO unc % pred: 79 %
DLCO unc: 13.32 ml/min/mmHg
FEF 25-75 Post: 0.64 L/sec
FEF 25-75 Pre: 0.45 L/sec
FEF2575-%Change-Post: 44 %
FEF2575-%Pred-Post: 33 %
FEF2575-%Pred-Pre: 23 %
FEV1-%Change-Post: 15 %
FEV1-%Pred-Post: 55 %
FEV1-%Pred-Pre: 48 %
FEV1-Post: 1.1 L
FEV1-Pre: 0.96 L
FEV1FVC-%Change-Post: 3 %
FEV1FVC-%Pred-Pre: 71 %
FEV6-%Change-Post: 11 %
FEV6-%Pred-Post: 76 %
FEV6-%Pred-Pre: 68 %
FEV6-Post: 1.9 L
FEV6-Pre: 1.71 L
FEV6FVC-%Change-Post: 0 %
FEV6FVC-%Pred-Post: 103 %
FEV6FVC-%Pred-Pre: 103 %
FVC-%Change-Post: 11 %
FVC-%Pred-Post: 73 %
FVC-%Pred-Pre: 66 %
FVC-Post: 1.92 L
FVC-Pre: 1.73 L
Post FEV1/FVC ratio: 57 %
Post FEV6/FVC ratio: 99 %
Pre FEV1/FVC ratio: 55 %
Pre FEV6/FVC Ratio: 99 %
RV % pred: 125 %
RV: 2.28 L
TLC % pred: 97 %
TLC: 4.2 L

## 2022-08-04 MED ORDER — ALBUTEROL SULFATE HFA 108 (90 BASE) MCG/ACT IN AERS: 2.0000 | INHALATION_SPRAY | RESPIRATORY_TRACT | 2 refills | Status: DC | PRN

## 2022-08-04 NOTE — Patient Instructions (Signed)
Full PFT Performed Today  

## 2022-08-04 NOTE — Progress Notes (Signed)
Subjective:   PATIENT ID: Christine Shannon GENDER: female DOB: June 07, 1955, MRN: [id number]  Chief Complaint  Patient presents with   Consult    Sob since oct 2023 Some day smoker    Reason for Visit: New consult for shortness of breath  [name] is a 67 year old female former smoker with HTN, dysphagia s/p esophageal dilation 2024, reflux who presents as a new consult for shortness of breath.  Since the 6 months she reports worsening shortness of breath but endorses this has been a long term issues. She reports respiratory infections 1-2 x a year requiring antibiotics and steroids sometimes. She was seen in the ED for the first time in October 2023 (6 months ago) for upper URI with pneumonia. Returned to urgent care bc  she did not tolerate prednisone due to HTN and treated with z-pack on that visit. She will feel breathless with walking short distances (10 steps) but is still able to complete the task. She walks at a slower pace from her car/parking lot to work. She started walking 1/4-1/2 mile track and was able to do this. Denies wheezing and cough. She uses albuterol with activity.  Social History: Quit smoking 03/2021. She will intermittently smoke socially 1-2 cigarettes Denies vaping  Environmental exposures: Works in moldy basement 2 years  I have personally reviewed patient's past medical/family/social history, allergies, current medications.  Past Medical History:  Diagnosis Date   Anxiety    Depression    Endometriosis    GERD (gastroesophageal reflux disease)    Infertility, female    Pneumonia 01/2022   Regurgitation    of heart   Urinary incontinence      Family History  Problem Relation Age of Onset   Breast cancer Mother 21       very early first stage BRACA negative   Stroke Mother    Prostate cancer Father    Diabetes Father    Stomach cancer Neg Hx    Colon cancer Neg Hx    Esophageal cancer Neg Hx    Pancreatic cancer Neg Hx      Social  History   Occupational History   Not on file  Tobacco Use   Smoking status: Some Days    Packs/day: 0.25    Years: 46.00    Additional pack years: 0.00    Total pack years: 11.50    Types: Cigarettes    Last attempt to quit: 2022    Years since quitting: 2.2   Smokeless tobacco: Never   Tobacco comments:    Started age 65 stopped smoking daily 2022 currently smokes some days  Vaping Use   Vaping Use: Never used  Substance and Sexual Activity   Alcohol use: Yes    Alcohol/week: 0.0 standard drinks of alcohol    Comment: 1-2 a month   Drug use: No   Sexual activity: Not Currently    Partners: Female, Female    Birth control/protection: Post-menopausal    No Known Allergies   Outpatient Medications Prior to Visit  Medication Sig Dispense Refill   albuterol (VENTOLIN HFA) 108 (90 Base) MCG/ACT inhaler SMARTSIG:2 Puff(s) By Mouth Every 4-6 Hours     erythromycin ophthalmic ointment Place a 1/2 inch ribbon of ointment into the lower eyelid. (Patient not taking: Reported on 08/04/2022) 3.5 g 0   omeprazole (PRILOSEC) 40 MG capsule Take 1 capsule (40 mg total) by mouth 2 (two) times daily before a meal. (Patient not taking: Reported  on 08/04/2022) 60 capsule 3   Facility-Administered Medications Prior to Visit  Medication Dose Route Frequency Provider Last Rate Last Admin   0.9 %  sodium chloride infusion  500 mL Intravenous Continuous Jenel Lucks, MD        Review of Systems  Constitutional:  Negative for chills, diaphoresis, fever, malaise/fatigue and weight loss.  HENT:  Negative for congestion.   Respiratory:  Positive for shortness of breath. Negative for cough, hemoptysis, sputum production and wheezing.   Cardiovascular:  Negative for chest pain, palpitations and leg swelling.     Objective:   Vitals:   08/04/22 0858  BP: (!) 162/86  Pulse: 74  SpO2: 99%  Weight: 157 lb 3.2 oz (71.3 kg)  Height:  (1.499 m)   SpO2: 99 % O2 Device: None (Room  air)  Physical Exam: General: Well-appearing, no acute distress HENT: [place], AT Eyes: EOMI, no scleral icterus Respiratory: Clear to auscultation bilaterally.  No crackles, wheezing or rales Cardiovascular: RRR, -M/R/G, no JVD Extremities:-Edema,-tenderness Neuro: AAO x4, CNII-XII grossly intact Psych: Normal mood, normal affect  Data Reviewed:  Imaging: CXR 01/22/22 - Subtle opacities LLL base  PFT: None on file  Labs: CBC    Component Value Date/Time   WBC 7.8 02/02/2022 0951   RBC 4.68 02/02/2022 0951   HGB 14.9 02/02/2022 0951   HCT 43.9 02/02/2022 0951   PLT 219.0 02/02/2022 0951   MCV 93.8 02/02/2022 0951   MCHC 34.0 02/02/2022 0951   RDW 15.3 02/02/2022 0951   LYMPHSABS 2.9 02/02/2022 0951   MONOABS 0.6 02/02/2022 0951   EOSABS 0.4 02/02/2022 0951   BASOSABS 0.0 02/02/2022 0951      Assessment & Plan:   Discussion: 67 year old female former smoker with HTN, dysphagia s/p esophageal dilation 2024, reflux who presents as a new consult for shortness of breath. Differential including undiagnosed obstructive or restrictive lung disease. Consider deconditioning.  Shortness of breath --ORDER pulmonary function tests --CONTINUE Albuterol AS NEEDED for shortness of breath or wheezing --Encourage walking at least 10,00 steps  Health Maintenance Immunization History  Administered Date(s) Administered   Tdap 08/13/2014   CT Lung Screen - none qualified  Orders Placed This Encounter  Procedures   Pulmonary function test    Standing Status:   Future    Standing Expiration Date:   08/04/2023    Order Specific Question:   Where should this test be performed?    Answer:   [facility]    Order Specific Question:   Full PFT: includes the following: basic spirometry, spirometry pre & post bronchodilator, diffusion capacity (DLCO), lung volumes    Answer:   Full PFT   Meds ordered this encounter  Medications   albuterol (VENTOLIN HFA) 108 (90 Base) MCG/ACT  inhaler    Sig: Inhale 2 puffs into the lungs every 4 (four) hours as needed for wheezing or shortness of breath.    Dispense:  8 g    Refill:  2    No follow-ups on file. After PFTs  I have spent a total time of 45-minutes on the day of the appointment reviewing prior documentation, coordinating care and discussing medical diagnosis and plan with the patient/family. Imaging, labs and tests included in this note have been reviewed and interpreted independently by me.  Reem Fleury Mechele Collin, MD [facility] 08/04/2022 9:23 AM  Office Number [phone]

## 2022-08-04 NOTE — Patient Instructions (Signed)
Shortness of breath --ORDER pulmonary function tests --CONTINUE Albuterol AS NEEDED for shortness of breath or wheezing --Encourage walking at least 10,00 steps a day --Consider pulmonary rehab in the future  OK to schedule PFTs when next available with follow-up in person visit or video visit (ok to use 4pm slot for video)

## 2022-08-04 NOTE — Progress Notes (Signed)
Full PFT Performed Today  

## 2022-08-13 ENCOUNTER — Telehealth (HOSPITAL_BASED_OUTPATIENT_CLINIC_OR_DEPARTMENT_OTHER): Payer: 59 | Admitting: Pulmonary Disease

## 2022-08-18 ENCOUNTER — Encounter (HOSPITAL_BASED_OUTPATIENT_CLINIC_OR_DEPARTMENT_OTHER): Payer: Self-pay | Admitting: Pulmonary Disease

## 2022-08-18 ENCOUNTER — Telehealth (INDEPENDENT_AMBULATORY_CARE_PROVIDER_SITE_OTHER): Payer: 59 | Admitting: Pulmonary Disease

## 2022-08-18 DIAGNOSIS — J4489 Other specified chronic obstructive pulmonary disease: Secondary | ICD-10-CM

## 2022-08-18 MED ORDER — FLUTICASONE-SALMETEROL 100-50 MCG/ACT IN AEPB: 1.0000 | INHALATION_SPRAY | Freq: Two times a day (BID) | RESPIRATORY_TRACT | 0 refills | Status: DC

## 2022-08-18 NOTE — Progress Notes (Signed)
Virtual Visit via Video Note  I connected with Christine Shannon on 08/18/22 at  4:00 PM EDT by a video enabled telemedicine application and verified that I am speaking with the correct person using two identifiers.  Location: Patient: Home Provider: [facility] office   I discussed the limitations of evaluation and management by telemedicine and the availability of in person appointments. The patient expressed understanding and agreed to proceed.   I discussed the assessment and treatment plan with the patient. The patient was provided an opportunity to ask questions and all were answered. The patient agreed with the plan and demonstrated an understanding of the instructions.   The patient was advised to call back or seek an in-person evaluation if the symptoms worsen or if the condition fails to improve as anticipated.   Subjective:   PATIENT ID: Christine Shannon GENDER: female DOB: 15-Nov-1955, MRN: [id number]  Chief Complaint  Patient presents with   Follow-up    PFTs    Reason for Visit: Follow-up  [name] is a 67 year old female former smoker with HTN, dysphagia s/p esophageal dilation 2024, reflux who presents for follow-up shortness of breath.  Initial consult Since the 6 months she reports worsening shortness of breath but endorses this has been a long term issues. She reports respiratory infections 1-2 x a year requiring antibiotics and steroids sometimes. She was seen in the ED for the first time in October 2023 (6 months ago) for upper URI with pneumonia. Returned to urgent care bc  she did not tolerate prednisone due to HTN and treated with z-pack on that visit. She will feel breathless with walking short distances (10 steps) but is still able to complete the task. She walks at a slower pace from her car/parking lot to work. She started walking 1/4-1/2 mile track and was able to do this. Denies wheezing and cough. She uses albuterol with  activity.  08/18/22 Since our last visit she is using her albuterol prior to activity. Unsure if it helps. Continues to have unchanged shortness of breath especially with exertion or when carrying things. Feels she is able to walk on a 1/2 mile track without issues. No wheezing or cough.  Social History: Quit smoking 03/2021. She will intermittently smoke socially 1-2 cigarettes Denies vaping  Environmental exposures: Works in [employer] basement 2 years   Past Medical History:  Diagnosis Date   Anxiety    Depression    Endometriosis    GERD (gastroesophageal reflux disease)    Infertility, female    Pneumonia 01/2022   Regurgitation    of heart   Urinary incontinence      Family History  Problem Relation Age of Onset   Breast cancer Mother 54       very early first stage BRACA negative   Stroke Mother    Prostate cancer Father    Diabetes Father    Stomach cancer Neg Hx    Colon cancer Neg Hx    Esophageal cancer Neg Hx    Pancreatic cancer Neg Hx      Social History   Occupational History   Not on file  Tobacco Use   Smoking status: Some Days    Packs/day: 0.25    Years: 46.00    Additional pack years: 0.00    Total pack years: 11.50    Types: Cigarettes    Last attempt to quit: 2022    Years since quitting: 2.3   Smokeless  tobacco: Never   Tobacco comments:    Started age 83 stopped smoking daily 2022 currently smokes some days  Vaping Use   Vaping Use: Never used  Substance and Sexual Activity   Alcohol use: Yes    Alcohol/week: 0.0 standard drinks of alcohol    Comment: 1-2 a month   Drug use: No   Sexual activity: Not Currently    Partners: Female, Female    Birth control/protection: Post-menopausal    No Known Allergies   Outpatient Medications Prior to Visit  Medication Sig Dispense Refill   albuterol (VENTOLIN HFA) 108 (90 Base) MCG/ACT inhaler Inhale 2 puffs into the lungs every 4 (four) hours as needed for wheezing or shortness of breath. 8 g 2    Facility-Administered Medications Prior to Visit  Medication Dose Route Frequency Provider Last Rate Last Admin   0.9 %  sodium chloride infusion  500 mL Intravenous Continuous Jenel Lucks, MD        Review of Systems  Constitutional:  Negative for chills, diaphoresis, fever, malaise/fatigue and weight loss.  HENT:  Negative for congestion.   Respiratory:  Positive for shortness of breath. Negative for cough, hemoptysis, sputum production and wheezing.   Cardiovascular:  Negative for chest pain, palpitations and leg swelling.     Objective:   There were no vitals filed for this visit.    Physical Exam: General: Well-appearing, no acute distress HENT: [place], AT Eyes: EOMI, no scleral icterus Respiratory: No respiratory distress Extremities:-Edema,-tenderness Neuro: AAO x4, CNII-XII grossly intact Psych: Normal mood, normal affect   Data Reviewed:  Imaging: CXR 01/22/22 - Subtle opacities LLL base  PFT: 08/18/22 FVC 1.92 (73%) FEV1 1.10 (55%) Ratio 57  TLC 97% RV/TLC 132%  DLCO 79% Interpretation: Moderately severe obstructive defect with air trapping and mildly reduced DLCO. Significant bronchodilator response present   Labs: CBC    Component Value Date/Time   WBC 7.8 02/02/2022 0951   RBC 4.68 02/02/2022 0951   HGB 14.9 02/02/2022 0951   HCT 43.9 02/02/2022 0951   PLT 219.0 02/02/2022 0951   MCV 93.8 02/02/2022 0951   MCHC 34.0 02/02/2022 0951   RDW 15.3 02/02/2022 0951   LYMPHSABS 2.9 02/02/2022 0951   MONOABS 0.6 02/02/2022 0951   EOSABS 0.4 02/02/2022 0951   BASOSABS 0.0 02/02/2022 0951      Assessment & Plan:   Discussion: 67 year old female former smoker with HTN, dysphagia s/p esophageal dilation 2024, reflux who presents for follow-up. Reviewed PFTs with are consistent with COPD-asthma. Discussed bronchodilator mechanism and options. Dyspnea likely related to untreated chronic lung disease and deconditioning. Discussed clinical course and  management of COPD/asthma including bronchodilator regimen and action plan for exacerbation.  COPD-asthma --Reviewed PFTs. Consistent with COPD-asthma --START Wixela 100-50 mcg ONE puff in the morning and evening. Rinse mouth after use --CONTINUE Albuterol AS NEEDED for shortness of breath or wheezing --Encourage walking at least 10,00 steps  Health Maintenance Immunization History  Administered Date(s) Administered   Tdap 08/13/2014   CT Lung Screen - none qualified  No orders of the defined types were placed in this encounter.  Meds ordered this encounter  Medications   fluticasone-salmeterol (WIXELA INHUB) 100-50 MCG/ACT AEPB    Sig: Inhale 1 puff into the lungs 2 (two) times daily.    Dispense:  180 each    Refill:  0    Return in about 3 months (around 11/18/2022).   I have spent a total time of 35-minutes on the  day of the appointment including chart review, data review, collecting history, coordinating care and discussing medical diagnosis and plan with the patient/family. Past medical history, allergies, medications were reviewed. Pertinent imaging, labs and tests included in this note have been reviewed and interpreted independently by me.  Caius Silbernagel Mechele Collin, MD [facility] 08/18/2022 4:38 PM  Office Number [phone]

## 2022-08-18 NOTE — Patient Instructions (Signed)
COPD-asthma --Reviewed PFTs. Consistent with COPD-asthma --START Wixela 100-50 mcg ONE puff in the morning and evening. Rinse mouth after use --CONTINUE Albuterol AS NEEDED for shortness of breath or wheezing --Encourage walking at least 10,00 steps

## 2022-08-19 ENCOUNTER — Other Ambulatory Visit (HOSPITAL_COMMUNITY): Payer: Self-pay

## 2022-10-24 ENCOUNTER — Other Ambulatory Visit (HOSPITAL_BASED_OUTPATIENT_CLINIC_OR_DEPARTMENT_OTHER): Payer: Self-pay | Admitting: Pulmonary Disease

## 2022-12-15 ENCOUNTER — Encounter (HOSPITAL_BASED_OUTPATIENT_CLINIC_OR_DEPARTMENT_OTHER): Payer: Self-pay | Admitting: Pulmonary Disease

## 2022-12-15 ENCOUNTER — Ambulatory Visit (HOSPITAL_BASED_OUTPATIENT_CLINIC_OR_DEPARTMENT_OTHER): Payer: 59 | Admitting: Pulmonary Disease

## 2022-12-15 VITALS — BP 148/76 | HR 79 | Resp 16 | Ht 59.0 in | Wt 162.6 lb

## 2022-12-15 DIAGNOSIS — J4489 Other specified chronic obstructive pulmonary disease: Secondary | ICD-10-CM

## 2022-12-15 MED ORDER — FLUTICASONE-SALMETEROL 250-50 MCG/ACT IN AEPB: 1.0000 | INHALATION_SPRAY | Freq: Two times a day (BID) | RESPIRATORY_TRACT | 5 refills | Status: DC

## 2022-12-15 MED ORDER — ALBUTEROL SULFATE HFA 108 (90 BASE) MCG/ACT IN AERS: 2.0000 | INHALATION_SPRAY | RESPIRATORY_TRACT | 11 refills | Status: DC | PRN

## 2022-12-15 NOTE — Progress Notes (Signed)
Subjective:   PATIENT ID: Christine Shannon GENDER: female DOB: 1956-03-06, MRN: [id number]  Chief Complaint  Patient presents with   Follow-up    Doing about the same as last time. She is still getting out of breath. No consistent.     Reason for Visit: Follow-up  [name] is a 67 year old female former smoker with HTN, dysphagia s/p esophageal dilation 2024, reflux who presents for follow-up shortness of breath.  Initial consult Since the 6 months she reports worsening shortness of breath but endorses this has been a long term issues. She reports respiratory infections 1-2 x a year requiring antibiotics and steroids sometimes. She was seen in the ED for the first time in October 2023 (6 months ago) for upper URI with pneumonia. Returned to urgent care bc  she did not tolerate prednisone due to HTN and treated with z-pack on that visit. She will feel breathless with walking short distances (10 steps) but is still able to complete the task. She walks at a slower pace from her car/parking lot to work. She started walking 1/4-1/2 mile track and was able to do this. Denies wheezing and cough. She uses albuterol with activity.  08/18/22 Since our last visit she is using her albuterol prior to activity. Unsure if it helps. Continues to have unchanged shortness of breath especially with exertion or when carrying things. Feels she is able to walk on a 1/2 mile track without issues. No wheezing or cough.  12/15/22 Since our last visit she has shortness of breath when carrying or pulling her suitcase. Able to walk without carrying things. 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 Denies wheezing.  Social History: Quit smoking 03/2021. She will intermittently smoke socially 1-2 cigarettes Denies vaping  Environmental exposures: Works in [employer] basement 2 years   Past Medical History:  Diagnosis Date   Anxiety     Depression    Endometriosis    GERD (gastroesophageal reflux disease)    Infertility, female    Pneumonia 01/2022   Regurgitation    of heart   Urinary incontinence      Family History  Problem Relation Age of Onset   Breast cancer Mother 82       very early first stage BRACA negative   Stroke Mother    Prostate cancer Father    Diabetes Father    Stomach cancer Neg Hx    Colon cancer Neg Hx    Esophageal cancer Neg Hx    Pancreatic cancer Neg Hx      Social History   Occupational History   Not on file  Tobacco Use   Smoking status: Some Days    Current packs/day: 0.00    Average packs/day: 0.3 packs/day for 46.0 years (11.5 ttl pk-yrs)    Types: Cigarettes    Start date: 3    Last attempt to quit: 2022    Years since quitting: 2.6   Smokeless tobacco: Never   Tobacco comments:    Started age 34 stopped smoking daily 2022 currently smokes some days  Vaping Use   Vaping status: Never Used  Substance and Sexual Activity   Alcohol use: Yes    Alcohol/week: 0.0 standard drinks of alcohol    Comment: 1-2 a month   Drug use: No   Sexual activity: Not Currently    Partners: Female, Female    Birth control/protection: Post-menopausal  No Known Allergies   Outpatient Medications Prior to Visit  Medication Sig Dispense Refill   albuterol (VENTOLIN HFA) 108 (90 Base) MCG/ACT inhaler Inhale 2 puffs into the lungs every 4 (four) hours as needed for wheezing or shortness of breath. 8 g 2   fluticasone-salmeterol (ADVAIR) 100-50 MCG/ACT AEPB USE 1 INHALATION BY MOUTH TWICE  DAILY 180 each 3   Facility-Administered Medications Prior to Visit  Medication Dose Route Frequency Provider Last Rate Last Admin   0.9 %  sodium chloride infusion  500 mL Intravenous Continuous Jenel Lucks, MD        Review of Systems  Constitutional:  Negative for chills, diaphoresis, fever, malaise/fatigue and weight loss.  HENT:  Negative for congestion.   Respiratory:  Positive  for cough and shortness of breath. Negative for hemoptysis, sputum production and wheezing.   Cardiovascular:  Negative for chest pain, palpitations and leg swelling.     Objective:   Vitals:   12/15/22 1454 12/15/22 1456  BP: (!) 154/78 (!) 148/76  Pulse: 79   Resp: 16   SpO2: 95%   Weight: 162 lb 9.6 oz (73.8 kg)   Height: 4\' 11"  (1.499 m)     SpO2: 95 %  Physical Exam: General: Well-appearing, no acute distress HENT: [place], AT Eyes: EOMI, no scleral icterus Respiratory: Clear to auscultation bilaterally.  No crackles, wheezing or rales Cardiovascular: RRR, -M/R/G, no JVD Extremities:-Edema,-tenderness Neuro: AAO x4, CNII-XII grossly intact Psych: Normal mood, normal affect  Data Reviewed:  Imaging: CXR 01/22/22 - Subtle opacities LLL base  PFT: 08/18/22 FVC 1.92 (73%) FEV1 1.10 (55%) Ratio 57  TLC 97% RV/TLC 132%  DLCO 79% Interpretation: Moderately severe obstructive defect with air trapping and mildly reduced DLCO. Significant bronchodilator response present   Labs: CBC    Component Value Date/Time   WBC 7.8 02/02/2022 0951   RBC 4.68 02/02/2022 0951   HGB 14.9 02/02/2022 0951   HCT 43.9 02/02/2022 0951   PLT 219.0 02/02/2022 0951   MCV 93.8 02/02/2022 0951   MCHC 34.0 02/02/2022 0951   RDW 15.3 02/02/2022 0951   LYMPHSABS 2.9 02/02/2022 0951   MONOABS 0.6 02/02/2022 0951   EOSABS 0.4 02/02/2022 0951   BASOSABS 0.0 02/02/2022 0951      Assessment & Plan:   Discussion: 67 year old female former smoker with HTN, dysphagia s/p esophageal dilation 2024, reflux who presents for follow-up. Reviewed PFTs with are consistent with COPD-asthma. Symptoms improved but still has breakthrough symptoms  Dyspnea likely related to untreated chronic lung disease and deconditioning. Discussed clinical course and management of COPD/asthma including bronchodilator regimen, exercise and action plan for exacerbation. Offered pulmonary rehab however patient has prior commitments  on those days.  COPD-asthma - improved but persistent symptoms --INCREASE Wixela 250-50 mcg ONE puff in the morning and evening. Rinse mouth after use --CONTINUE Albuterol AS NEEDED for shortness of breath or wheezing --Encourage walking at least 10,000 steps --Encourage upper body exercises 3 days a week for 20 min  Health Maintenance Immunization History  Administered Date(s) Administered   Tdap 08/13/2014   CT Lung Screen - none qualified  No orders of the defined types were placed in this encounter.  Meds ordered this encounter  Medications   fluticasone-salmeterol (WIXELA INHUB) 250-50 MCG/ACT AEPB    Sig: Inhale 1 puff into the lungs in the morning and at bedtime.    Dispense:  60 each    Refill:  5   albuterol (VENTOLIN HFA) 108 (90 Base)  MCG/ACT inhaler    Sig: Inhale 2 puffs into the lungs every 4 (four) hours as needed for wheezing or shortness of breath.    Dispense:  8 g    Refill:  11    Return in about 6 months (around 06/17/2023).   I have spent a total time of 35-minutes on the day of the appointment including chart review, data review, collecting history, coordinating care and discussing medical diagnosis and plan with the patient/family. Past medical history, allergies, medications were reviewed. Pertinent imaging, labs and tests included in this note have been reviewed and interpreted independently by me.  Darlis Wragg Mechele Collin, MD [facility] 12/15/2022 4:58 PM  Office Number [phone]

## 2022-12-15 NOTE — Patient Instructions (Signed)
COPD-asthma - improved but persistent symptoms --INCREASE Wixela 250-50 mcg ONE puff in the morning and evening. Rinse mouth after use --CONTINUE Albuterol AS NEEDED for shortness of breath or wheezing --Encourage walking at least 10,000 steps --Encourage upper body exercises 3 days a week for 20 min

## 2022-12-17 ENCOUNTER — Other Ambulatory Visit (HOSPITAL_BASED_OUTPATIENT_CLINIC_OR_DEPARTMENT_OTHER): Payer: Self-pay

## 2022-12-17 MED ORDER — ALBUTEROL SULFATE HFA 108 (90 BASE) MCG/ACT IN AERS: 2.0000 | INHALATION_SPRAY | Freq: Four times a day (QID) | RESPIRATORY_TRACT | 2 refills | Status: DC | PRN

## 2022-12-17 NOTE — Progress Notes (Signed)
Generic proair is covered by insurance. Medication changed.

## 2022-12-18 ENCOUNTER — Telehealth: Payer: 59 | Admitting: Family Medicine

## 2022-12-18 ENCOUNTER — Encounter: Payer: 59 | Admitting: Nurse Practitioner

## 2022-12-18 DIAGNOSIS — J45909 Unspecified asthma, uncomplicated: Secondary | ICD-10-CM

## 2022-12-18 MED ORDER — ALBUTEROL SULFATE HFA 108 (90 BASE) MCG/ACT IN AERS: 2.0000 | INHALATION_SPRAY | Freq: Four times a day (QID) | RESPIRATORY_TRACT | 0 refills | Status: DC | PRN

## 2022-12-18 NOTE — Progress Notes (Signed)
VIRTUAL VISIT COMPLETED

## 2022-12-18 NOTE — Patient Instructions (Signed)
Asthma, Adult  Asthma is a long-term (chronic) condition that causes recurrent episodes in which the lower airways in the lungs become tight and narrow. The narrowing is caused by inflammation and tightening of the smooth muscle around the lower airways. Asthma episodes, also called asthma attacks or asthma flares, may cause coughing, making high-pitched whistling sounds when you breathe, most often when you breathe out (wheezing), shortness of breath, and chest pain. The airways may produce extra mucus caused by the inflammation and irritation. During an attack, it can be difficult to breathe. Asthma attacks can range from minor to life-threatening. Asthma cannot be cured, but medicines and lifestyle changes can help control it and treat acute attacks. It is important to keep your asthma well controlled so the condition does not interfere with your daily life. What are the causes? This condition is believed to be caused by inherited (genetic) and environmental factors, but its exact cause is not known. What can trigger an asthma attack? Many things can bring on an asthma attack or make symptoms worse. These triggers are different for every person. Common triggers include: Allergens and irritants like mold, dust, pet dander, cockroaches, pollen, air pollution, and chemical odors. Cigarette smoke. Weather changes and cold air. Stress and strong emotional responses such as crying or laughing hard. Certain medications such as aspirin or beta blockers. Infections and inflammatory conditions, such as the flu, a cold, pneumonia, or inflammation of the nasal membranes (rhinitis). Gastroesophageal reflux disease (GERD). What are the signs or symptoms? Symptoms may occur right after exposure to an asthma trigger or hours later and can vary by person. Common signs and symptoms include: Wheezing. Trouble breathing (shortness of breath). Excessive nighttime or early morning coughing. Chest  tightness. Tiredness (fatigue) with minimal activity. Difficulty talking in complete sentences. Poor exercise tolerance. How is this diagnosed? This condition is diagnosed based on: A physical exam and your medical history. Tests, which may include: Lung function studies to evaluate the flow of air in your lungs. Allergy tests. Imaging tests, such as X-rays. How is this treated? There is no cure, but symptoms can be controlled with proper treatment. Treatment usually involves: Identifying and avoiding your asthma triggers. Inhaled medicines. Two types are commonly used to treat asthma, depending on severity: Controller medicines. These help prevent asthma symptoms from occurring. They are taken every day. Fast-acting reliever or rescue medicines. These quickly relieve asthma symptoms. They are used as needed and provide short-term relief. Using other medicines, such as: Allergy medicines, such as antihistamines, if your asthma attacks are triggered by allergens. Immune medicines (immunomodulators). These are medicines that help control the immune system. Using supplemental oxygen. This is only needed during a severe episode. Creating an asthma action plan. An asthma action plan is a written plan for managing and treating your asthma attacks. This plan includes: A list of your asthma triggers and how to avoid them. Information about when medicines should be taken and when their dosage should be changed. Instructions about using a device called a peak flow meter. A peak flow meter measures how well the lungs are working and the severity of your asthma. It helps you monitor your condition. Follow these instructions at home: Take over-the-counter and prescription medicines only as told by your health care provider. Stay up to date on all vaccinations as recommended by your healthcare provider, including vaccines for the flu and pneumonia. Use a peak flow meter and keep track of your peak flow  readings. Understand and use your asthma   action plan to address any asthma flares. Do not smoke or allow anyone to smoke in your home. Contact a health care provider if: You have wheezing, shortness of breath, or a cough that is not responding to medicines. Your medicines are causing side effects, such as a rash, itching, swelling, or trouble breathing. You need to use a reliever medicine more than 2-3 times a week. Your peak flow reading is still at 50-79% of your personal best after following your action plan for 1 hour. You have a fever and shortness of breath. Get help right away if: You are getting worse and do not respond to treatment during an asthma attack. You are short of breath when at rest or when doing very little physical activity. You have difficulty eating, drinking, or talking. You have chest pain or tightness. You develop a fast heartbeat or palpitations. You have a bluish color to your lips or fingernails. You are light-headed or dizzy, or you faint. Your peak flow reading is less than 50% of your personal best. You feel too tired to breathe normally. These symptoms may be an emergency. Get help right away. Call 911. Do not wait to see if the symptoms will go away. Do not drive yourself to the hospital. Summary Asthma is a long-term (chronic) condition that causes recurrent episodes in which the airways become tight and narrow. Asthma episodes, also called asthma attacks or asthma flares, can cause coughing, wheezing, shortness of breath, and chest pain. Asthma cannot be cured, but medicines and lifestyle changes can help keep it well controlled and prevent asthma flares. Make sure you understand how to avoid triggers and how and when to use your medicines. Asthma attacks can range from minor to life-threatening. Get help right away if you have an asthma attack and do not respond to treatment with your usual rescue medicines. This information is not intended to replace  advice given to you by your health care provider. Make sure you discuss any questions you have with your health care provider. Document Revised: 01/21/2021 Document Reviewed: 01/12/2021 Elsevier Patient Education  2024 [employer]  

## 2022-12-18 NOTE — Progress Notes (Signed)
Virtual Visit Consent   Christine Shannon, you are scheduled for a virtual visit with a [facility] provider today. Just as with appointments in the office, your consent must be obtained to participate. Your consent will be active for this visit and any virtual visit you may have with one of our providers in the next 365 days. If you have a MyChart account, a copy of this consent can be sent to you electronically.  As this is a virtual visit, video technology does not allow for your provider to perform a traditional examination. This may limit your provider's ability to fully assess your condition. If your provider identifies any concerns that need to be evaluated in person or the need to arrange testing (such as labs, EKG, etc.), we will make arrangements to do so. Although advances in technology are sophisticated, we cannot ensure that it will always work on either your end or our end. If the connection with a video visit is poor, the visit may have to be switched to a telephone visit. With either a video or telephone visit, we are not always able to ensure that we have a secure connection.  By engaging in this virtual visit, you consent to the provision of healthcare and authorize for your insurance to be billed (if applicable) for the services provided during this visit. Depending on your insurance coverage, you may receive a charge related to this service.  I need to obtain your verbal consent now. Are you willing to proceed with your visit today? Christine Shannon has provided verbal consent on 12/18/2022 for a virtual visit (video or telephone). Georgana Curio, FNP  Date: 12/18/2022 10:39 AM  Virtual Visit via Video Note   I, Georgana Curio, connected with  Christine Shannon  ([id number], [birth year]) on 12/18/22 at 11:00 AM EDT by a video-enabled telemedicine application and verified that I am speaking with the correct person using two identifiers.  Location: Patient: Virtual Visit Location Patient:  Home Provider: Virtual Visit Location Provider: Home Office   I discussed the limitations of evaluation and management by telemedicine and the availability of in person appointments. The patient expressed understanding and agreed to proceed.    History of Present Illness: Christine Shannon is a 67 y.o. who identifies as a female who was assigned female at birth, and is being seen today for asthma. She had her albuterol inhaler filled with pcp this week but its mail order and hasn't come and she Is out of meds. She says she need one sent locally. She is not in any distress. 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   Dysphagia 02/02/2022   History of esophageal stricture 02/02/2022   Gastroesophageal reflux disease 02/02/2022   Nausea and vomiting 02/02/2022   Diarrhea 02/02/2022   Abnormal colonoscopy 02/02/2022    Allergies: No Known Allergies Medications:  Current Outpatient Medications:    albuterol (VENTOLIN HFA) 108 (90 Base) MCG/ACT inhaler, Inhale 2 puffs into the lungs every 6 (six) hours as needed for wheezing or shortness of breath., Disp: 1 each, Rfl: 0   albuterol (VENTOLIN HFA) 108 (90 Base) MCG/ACT inhaler, Inhale 2 puffs into the lungs every 6 (six) hours as needed for wheezing or shortness of breath., Disp: 24 g, Rfl: 2   fluticasone-salmeterol (WIXELA INHUB) 250-50 MCG/ACT AEPB, Inhale 1 puff into the lungs in the morning and at bedtime., Disp: 60 each, Rfl: 5  Current Facility-Administered Medications:    0.9 %  sodium chloride infusion, 500 mL, Intravenous, Continuous, Tomasa Rand, Dub Amis, MD  Observations/Objective: Patient is well-developed, well-nourished in no acute distress.  Resting comfortably  at home.  Head is normocephalic, atraumatic.  No labored breathing.  Speech is clear and coherent with logical content.  Patient is alert and oriented at baseline.    Assessment and Plan: 1. Moderate  asthma without complication, unspecified whether persistent  UC as needed. Follow up with pcp as needed.   Follow Up Instructions: I discussed the assessment and treatment plan with the patient. The patient was provided an opportunity to ask questions and all were answered. The patient agreed with the plan and demonstrated an understanding of the instructions.  A copy of instructions were sent to the patient via MyChart unless otherwise noted below.     The patient was advised to call back or seek an in-person evaluation if the symptoms worsen or if the condition fails to improve as anticipated.  Time:  I spent 8 minutes with the patient via telehealth technology discussing the above problems/concerns.    Georgana Curio, FNP

## 2023-01-10 ENCOUNTER — Telehealth: Payer: 59 | Admitting: Physician Assistant

## 2023-01-10 DIAGNOSIS — R3989 Other symptoms and signs involving the genitourinary system: Secondary | ICD-10-CM | POA: Diagnosis not present

## 2023-01-10 MED ORDER — SULFAMETHOXAZOLE-TRIMETHOPRIM 800-160 MG PO TABS: 1.0000 | ORAL_TABLET | Freq: Two times a day (BID) | ORAL | 0 refills | Status: DC

## 2023-01-10 NOTE — Progress Notes (Signed)
Virtual Visit Consent   Christine Shannon, you are scheduled for a virtual visit with a [facility] provider today. Just as with appointments in the office, your consent must be obtained to participate. Your consent will be active for this visit and any virtual visit you may have with one of our providers in the next 365 days. If you have a MyChart account, a copy of this consent can be sent to you electronically.  As this is a virtual visit, video technology does not allow for your provider to perform a traditional examination. This may limit your provider's ability to fully assess your condition. If your provider identifies any concerns that need to be evaluated in person or the need to arrange testing (such as labs, EKG, etc.), we will make arrangements to do so. Although advances in technology are sophisticated, we cannot ensure that it will always work on either your end or our end. If the connection with a video visit is poor, the visit may have to be switched to a telephone visit. With either a video or telephone visit, we are not always able to ensure that we have a secure connection.  By engaging in this virtual visit, you consent to the provision of healthcare and authorize for your insurance to be billed (if applicable) for the services provided during this visit. Depending on your insurance coverage, you may receive a charge related to this service.  I need to obtain your verbal consent now. Are you willing to proceed with your visit today? CODI LANSDEN has provided verbal consent on 01/10/2023 for a virtual visit (video or telephone). Margaretann Loveless, PA-C  Date: 01/10/2023 6:02 PM  Virtual Visit via Video Note   I, Margaretann Loveless, connected with  Christine Shannon  ([id number], January 03, 1956) on 01/10/23 at  6:00 PM EDT by a video-enabled telemedicine application and verified that I am speaking with the correct person using two identifiers.  Location: Patient: Virtual Visit Location  Patient: Home Provider: Virtual Visit Location Provider: Home Office   I discussed the limitations of evaluation and management by telemedicine and the availability of in person appointments. The patient expressed understanding and agreed to proceed.    History of Present Illness: Christine Shannon is a 67 y.o. who identifies as a female who was assigned female at birth, and is being seen today for possible UTI.  HPI: Urinary Tract Infection  This is a new problem. The current episode started in the past 7 days (Started Thursday or Friday, 09/19 or 09/20). The problem occurs every urination. The problem has been gradually worsening. The quality of the pain is described as aching and burning. The pain is mild. There has been no fever. Associated symptoms include frequency, hematuria (after using AZO;reports has had this happen with other medications in the past. Last dose of AZO was at 12:30 today and it is already improving), hesitancy and urgency. Pertinent negatives include no chills, discharge, flank pain, nausea or vomiting. Associated symptoms comments: Mild fatigue. She has tried acetaminophen (AZO) for the symptoms. The treatment provided no relief.     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   Dysphagia 02/02/2022   History of esophageal stricture 02/02/2022   Gastroesophageal reflux disease 02/02/2022   Nausea and vomiting 02/02/2022   Diarrhea 02/02/2022   Abnormal colonoscopy 02/02/2022    Allergies: No Known Allergies Medications:  Current Outpatient Medications:    sulfamethoxazole-trimethoprim (  BACTRIM DS) 800-160 MG tablet, Take 1 tablet by mouth 2 (two) times daily., Disp: 10 tablet, Rfl: 0   albuterol (VENTOLIN HFA) 108 (90 Base) MCG/ACT inhaler, Inhale 2 puffs into the lungs every 6 (six) hours as needed for wheezing or shortness of breath., Disp: 24 g, Rfl: 2   albuterol (VENTOLIN HFA) 108 (90 Base) MCG/ACT inhaler, Inhale  2 puffs into the lungs every 6 (six) hours as needed for wheezing or shortness of breath., Disp: 1 each, Rfl: 0   fluticasone-salmeterol (WIXELA INHUB) 250-50 MCG/ACT AEPB, Inhale 1 puff into the lungs in the morning and at bedtime., Disp: 60 each, Rfl: 5  Current Facility-Administered Medications:    0.9 %  sodium chloride infusion, 500 mL, Intravenous, Continuous, Cunningham, Scott E, MD  Observations/Objective: Patient is well-developed, well-nourished in no acute distress.  Resting comfortably at home.  Head is normocephalic, atraumatic.  No labored breathing.  Speech is clear and coherent with logical content.  Patient is alert and oriented at baseline.    Assessment and Plan: 1. Suspected UTI - sulfamethoxazole-trimethoprim (BACTRIM DS) 800-160 MG tablet; Take 1 tablet by mouth 2 (two) times daily.  Dispense: 10 tablet; Refill: 0  - Worsening symptoms.  - Will treat empirically with Bactrim - May use AZO for bladder spasms - Continue to push fluids.  - Seek in person evaluation for urine culture if symptoms do not improve or if they worsen.    Follow Up Instructions: I discussed the assessment and treatment plan with the patient. The patient was provided an opportunity to ask questions and all were answered. The patient agreed with the plan and demonstrated an understanding of the instructions.  A copy of instructions were sent to the patient via MyChart unless otherwise noted below.    The patient was advised to call back or seek an in-person evaluation if the symptoms worsen or if the condition fails to improve as anticipated.  Time:  I spent 8 minutes with the patient via telehealth technology discussing the above problems/concerns.    Margaretann Loveless, PA-C

## 2023-01-10 NOTE — Patient Instructions (Signed)
Christine Shannon, thank you for joining Christine Loveless, PA-C for today's virtual visit.  While this provider is not your primary care provider (PCP), if your PCP is located in our provider database this encounter information will be shared with them immediately following your visit.   A Christine Shannon account gives you access to today's visit and all your visits, tests, and labs performed at [facility] " click here if you don't have a Christine Shannon Shannon account or go to [email or web address]  Consent: (Patient) Christine Shannon provided verbal consent for this virtual visit at the beginning of the encounter.  Current Medications:  Current Outpatient Medications:    sulfamethoxazole-trimethoprim (BACTRIM DS) 800-160 MG tablet, Take 1 tablet by mouth 2 (two) times daily., Disp: 10 tablet, Rfl: 0   albuterol (VENTOLIN HFA) 108 (90 Base) MCG/ACT inhaler, Inhale 2 puffs into the lungs every 6 (six) hours as needed for wheezing or shortness of breath., Disp: 24 g, Rfl: 2   albuterol (VENTOLIN HFA) 108 (90 Base) MCG/ACT inhaler, Inhale 2 puffs into the lungs every 6 (six) hours as needed for wheezing or shortness of breath., Disp: 1 each, Rfl: 0   fluticasone-salmeterol (WIXELA INHUB) 250-50 MCG/ACT AEPB, Inhale 1 puff into the lungs in the morning and at bedtime., Disp: 60 each, Rfl: 5  Current Facility-Administered Medications:    0.9 %  sodium chloride infusion, 500 mL, Intravenous, Continuous, Cunningham, Scott E, MD   Medications ordered in this encounter:  Meds ordered this encounter  Medications   sulfamethoxazole-trimethoprim (BACTRIM DS) 800-160 MG tablet    Sig: Take 1 tablet by mouth 2 (two) times daily.    Dispense:  10 tablet    Refill:  0    Order Specific Question:   Supervising Provider    Answer:   Merrilee Jansky [id number]     *If you need refills on other medications prior to your next appointment, please contact your pharmacy*  Follow-Up: Call  back or seek an in-person evaluation if the symptoms worsen or if the condition fails to improve as anticipated.  [facility] [phone]  Other Instructions Urinary Tract Infection, Adult  A urinary tract infection (UTI) is an infection of any part of the urinary tract. The urinary tract includes the kidneys, ureters, bladder, and urethra. These organs make, store, and get rid of urine in the body. An upper UTI affects the ureters and kidneys. A lower UTI affects the bladder and urethra. What are the causes? Most urinary tract infections are caused by bacteria in your genital area around your urethra, where urine leaves your body. These bacteria grow and cause inflammation of your urinary tract. What increases the risk? You are more likely to develop this condition if: You have a urinary catheter that stays in place. You are not able to control when you urinate or have a bowel movement (incontinence). You are female and you: Use a spermicide or diaphragm for birth control. Have low estrogen levels. Are pregnant. You have certain genes that increase your risk. You are sexually active. You take antibiotic medicines. You have a condition that causes your flow of urine to slow down, such as: An enlarged prostate, if you are female. Blockage in your urethra. A kidney stone. A nerve condition that affects your bladder control (neurogenic bladder). Not getting enough to drink, or not urinating often. You have certain medical conditions, such as: Diabetes. A weak disease-fighting system (immunesystem). Sickle cell disease. Gout. Spinal cord  injury. What are the signs or symptoms? Symptoms of this condition include: Needing to urinate right away (urgency). Frequent urination. This may include small amounts of urine each time you urinate. Pain or burning with urination. Blood in the urine. Urine that smells bad or unusual. Trouble urinating. Cloudy urine. Vaginal  discharge, if you are female. Pain in the abdomen or the lower back. You may also have: Vomiting or a decreased appetite. Confusion. Irritability or tiredness. A fever or chills. Diarrhea. The first symptom in older adults may be confusion. In some cases, they may not have any symptoms until the infection has worsened. How is this diagnosed? This condition is diagnosed based on your medical history and a physical exam. You may also have other tests, including: Urine tests. Blood tests. Tests for STIs (sexually transmitted infections). If you have had more than one UTI, a cystoscopy or imaging studies may be done to determine the cause of the infections. How is this treated? Treatment for this condition includes: Antibiotic medicine. Over-the-counter medicines to treat discomfort. Drinking enough water to stay hydrated. If you have frequent infections or have other conditions such as a kidney stone, you may need to see a health care provider who specializes in the urinary tract (urologist). In rare cases, urinary tract infections can cause sepsis. Sepsis is a life-threatening condition that occurs when the body responds to an infection. Sepsis is treated in the hospital with IV antibiotics, fluids, and other medicines. Follow these instructions at home:  Medicines Take over-the-counter and prescription medicines only as told by your health care provider. If you were prescribed an antibiotic medicine, take it as told by your health care provider. Do not stop using the antibiotic even if you start to feel better. General instructions Make sure you: Empty your bladder often and completely. Do not hold urine for long periods of time. Empty your bladder after sex. Wipe from front to back after urinating or having a bowel movement if you are female. Use each tissue only one time when you wipe. Drink enough fluid to keep your urine pale yellow. Keep all follow-up visits. This is  important. Contact a health care provider if: Your symptoms do not get better after 1-2 days. Your symptoms go away and then return. Get help right away if: You have severe pain in your back or your lower abdomen. You have a fever or chills. You have nausea or vomiting. Summary A urinary tract infection (UTI) is an infection of any part of the urinary tract, which includes the kidneys, ureters, bladder, and urethra. Most urinary tract infections are caused by bacteria in your genital area. Treatment for this condition often includes antibiotic medicines. If you were prescribed an antibiotic medicine, take it as told by your health care provider. Do not stop using the antibiotic even if you start to feel better. Keep all follow-up visits. This is important. This information is not intended to replace advice given to you by your health care provider. Make sure you discuss any questions you have with your health care provider. Document Revised: 11/11/2019 Document Reviewed: 11/16/2019 Elsevier Patient Education  2024 [employer]    If you have been instructed to have an in-person evaluation today at a local Urgent Care facility, please use the link below. It will take you to a list of all of our available [facility], including address, phone number and hours of operation. Please do not delay care.  [facility]  If you  or a family member do not have a primary care provider, use the link below to schedule a visit and establish care. When you choose a [facility] primary care physician or advanced practice provider, you gain a long-term partner in health. Find a Primary Care Provider  Learn more about [facility]'s in-office and virtual care options: [place] - Get Care Now

## 2023-01-13 ENCOUNTER — Ambulatory Visit (HOSPITAL_COMMUNITY)
Admission: RE | Admit: 2023-01-13 | Discharge: 2023-01-13 | Disposition: A | Discharge status: Home / Self Care | Payer: 59 | Source: Ambulatory Visit | Attending: Internal Medicine | Admitting: Internal Medicine

## 2023-01-13 ENCOUNTER — Encounter (HOSPITAL_COMMUNITY): Payer: Self-pay

## 2023-01-13 VITALS — BP 177/82 | HR 88 | Temp 98.1°F | Resp 16

## 2023-01-13 DIAGNOSIS — R1030 Lower abdominal pain, unspecified: Secondary | ICD-10-CM | POA: Diagnosis present

## 2023-01-13 DIAGNOSIS — R300 Dysuria: Secondary | ICD-10-CM | POA: Diagnosis present

## 2023-01-13 LAB — POCT URINALYSIS DIP (MANUAL ENTRY)
Bilirubin, UA: NEGATIVE
Glucose, UA: NEGATIVE mg/dL
Ketones, POC UA: NEGATIVE mg/dL
Nitrite, UA: NEGATIVE
Protein Ur, POC: NEGATIVE mg/dL
Spec Grav, UA: 1.02 (ref 1.010–1.025)
Urobilinogen, UA: 0.2 E.U./dL
pH, UA: 5.5 (ref 5.0–8.0)

## 2023-01-13 MED ORDER — CEPHALEXIN 500 MG PO CAPS: 500.0000 mg | ORAL_CAPSULE | Freq: Four times a day (QID) | ORAL | 0 refills | Status: AC

## 2023-01-13 NOTE — Discharge Instructions (Addendum)
As we discussed, please stop taking the Bactrim and start taking Keflex 4 times daily for 5 days to treat the urinary tract infection.  I would think if the Bactrim is going to help, it would have significantly improved your symptoms by now.  Try to space out this medication every 6 hours.  Continue drinking plenty of water.  In addition, to help with your bowel movements, start taking MiraLAX, Metamucil, or Benefiber at least twice daily to help soften your stools.  Seek care in the emergency room if your pain does not improve significantly or if you develop fever, nausea/vomiting, decreased appetite, or flulike symptoms and are feeling very poorly.

## 2023-01-13 NOTE — ED Provider Notes (Signed)
[facility]    CSN: [id number] Arrival date & time: 01/13/23  0841      History   Chief Complaint Chief Complaint  Patient presents with   Dysuria    HPI Christine Shannon is a 67 y.o. female.   Patient presents today with 1 week history of lower abdominal pain, dysuria, urinary frequency, and urinary urgency.  Also endorses blood when she wipes on the toilet paper that has not resolved.  Reports she did a televisit with started on Bactrim on 01/10/2023 and symptoms initially improved, however she woke up with worsening abdominal pain this morning.  She is also still having a little bit of dysuria and urinary frequency, but is much improved.  No fevers or nausea/vomiting.  Last normal bowel movement was 10 days ago per her report, reports she normally goes at least twice daily.  Reports she has had more frequency of stools that is a small amount with urinating but has not had a "normal" bowel movement.  Reports she is eating and drinking normally.  No fever or nausea/vomiting.  No vaginal discharge.    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   Dysphagia 02/02/2022   History of esophageal stricture 02/02/2022   Gastroesophageal reflux disease 02/02/2022   Nausea and vomiting 02/02/2022   Diarrhea 02/02/2022   Abnormal colonoscopy 02/02/2022    Past Surgical History:  Procedure Laterality Date   CESAREAN SECTION     2 c-sections   cyst removed     from under left armpit   ESOPHAGOGASTRODUODENOSCOPY     Dr Loreta Ave   EXPLORATORY LAPAROTOMY      OB History     Gravida  2   Para  2   Term  2   Preterm      AB      Living  2      SAB      IAB      Ectopic      Multiple      Live  Births  2            Home Medications    Prior to Admission medications   Medication Sig Start Date End Date Taking? Authorizing Provider  cephALEXin (KEFLEX) 500 MG capsule Take 1 capsule (500 mg total) by mouth 4 (four) times daily for 5 days. 01/13/23 01/18/23 Yes Valentino Nose, NP  albuterol (VENTOLIN HFA) 108 (90 Base) MCG/ACT inhaler Inhale 2 puffs into the lungs every 6 (six) hours as needed for wheezing or shortness of breath. 12/17/22   Luciano Cutter, MD  albuterol (VENTOLIN HFA) 108 (90 Base) MCG/ACT inhaler Inhale 2 puffs into the lungs every 6 (six) hours as needed for wheezing or shortness of breath. 12/18/22 01/17/23  Delorse Lek, FNP  fluticasone-salmeterol (WIXELA INHUB) 250-50 MCG/ACT AEPB Inhale 1 puff into the lungs in the morning and at bedtime. 12/15/22   Luciano Cutter, MD    Family History Family History  Problem Relation Age of Onset   Breast cancer Mother 96       very early first stage BRACA negative   Stroke Mother  Prostate cancer Father    Diabetes Father    Stomach cancer Neg Hx    Colon cancer Neg Hx    Esophageal cancer Neg Hx    Pancreatic cancer Neg Hx     Social History Social History   Tobacco Use   Smoking status: Former    Current packs/day: 0.00    Average packs/day: 0.3 packs/day for 46.0 years (11.5 ttl pk-yrs)    Types: Cigarettes    Start date: 76    Quit date: 2022    Years since quitting: 2.7   Smokeless tobacco: Never   Tobacco comments:    Started age 54 stopped smoking daily 2022 currently smokes some days  Vaping Use   Vaping status: Never Used  Substance Use Topics   Alcohol use: Yes    Alcohol/week: 0.0 standard drinks of alcohol    Comment: 1-2 a month   Drug use: No     Allergies   Patient has no known allergies.   Review of Systems Review of Systems Per HPI  Physical Exam Triage Vital Signs ED Triage Vitals  Encounter Vitals Group     BP 01/13/23 0915 (!) 177/82     Systolic BP  Percentile --      Diastolic BP Percentile --      Pulse Rate 01/13/23 0915 88     Resp 01/13/23 0915 16     Temp 01/13/23 0915 98.1 F (36.7 C)     Temp Source 01/13/23 0915 Oral     SpO2 01/13/23 0915 93 %     Weight --      Height --      Head Circumference --      Peak Flow --      Pain Score 01/13/23 0918 8     Pain Loc --      Pain Education --      Exclude from Growth Chart --    No data found.  Updated Vital Signs BP (!) 177/82 (BP Location: Right Arm)   Pulse 88   Temp 98.1 F (36.7 C) (Oral)   Resp 16   LMP 04/19/2002   SpO2 93%   Visual Acuity Right Eye Distance:   Left Eye Distance:   Bilateral Distance:    Right Eye Near:   Left Eye Near:    Bilateral Near:     Physical Exam Vitals and nursing note reviewed.  Constitutional:      General: She is not in acute distress.    Appearance: She is not toxic-appearing.  HENT:     Mouth/Throat:     Mouth: Mucous membranes are moist.     Pharynx: Oropharynx is clear.  Cardiovascular:     Rate and Rhythm: Normal rate and regular rhythm.  Pulmonary:     Effort: Pulmonary effort is normal. No respiratory distress.     Breath sounds: Normal breath sounds. No wheezing, rhonchi or rales.  Abdominal:     General: Abdomen is flat. Bowel sounds are normal. There is no distension.     Palpations: Abdomen is soft. There is no mass.     Tenderness: There is no abdominal tenderness. There is no right CVA tenderness, left CVA tenderness or guarding.  Skin:    General: Skin is warm and dry.     Coloration: Skin is not jaundiced or pale.     Findings: No erythema.  Neurological:     Mental Status: She is alert and oriented to person, place, and  time.     Motor: No weakness.     Gait: Gait normal.  Psychiatric:        Behavior: Behavior is cooperative.      UC Treatments / Results  Labs (all labs ordered are listed, but only abnormal results are displayed) Labs Reviewed  POCT URINALYSIS DIP (MANUAL ENTRY) -  Abnormal; Notable for the following components:      Result Value   Clarity, UA cloudy (*)    Blood, UA moderate (*)    Leukocytes, UA Large (3+) (*)    All other components within normal limits  URINE CULTURE    EKG   Radiology No results found.  Procedures Procedures (including critical care time)  Medications Ordered in UC Medications - No data to display  Initial Impression / Assessment and Plan / UC Course  I have reviewed the triage vital signs and the nursing notes.  Pertinent labs & imaging results that were available during my care of the patient were reviewed by me and considered in my medical decision making (see chart for details).  Patient is hypertensive in triage, otherwise vital signs are stable.  1. Lower abdominal pain 2. Dysuria Examination is reassuring, no guarding or pain with palpation of the abdomen Urinalysis today is cloudy with moderate blood, large leukocyte Estrace Patient has been on Bactrim DS twice daily since 01/10/2023, would suspect symptoms and urine should be much improved by now, therefore suspect possible resistance in urine Urine culture from today is pending, televisit was performed initially and therefore no urine sample was obtained Will start patient on Keflex 4 times daily for 5 days while urine cultures pending Recommended increasing water intake, stool softener like MiraLAX, Metamucil, or Benefiber to help normalize BM Strict ER precautions discussed with patient I recommended she be seen in the emergency room if symptoms are not significantly improved in the next 48 hours Work excuse given  The patient was given the opportunity to ask questions.  All questions answered to their satisfaction.  The patient is in agreement to this plan.    Final Clinical Impressions(s) / UC Diagnoses   Final diagnoses:  Lower abdominal pain  Dysuria     Discharge Instructions      As we discussed, please stop taking the Bactrim and start  taking Keflex 4 times daily for 5 days to treat the urinary tract infection.  I would think if the Bactrim is going to help, it would have significantly improved your symptoms by now.  Try to space out this medication every 6 hours.  Continue drinking plenty of water.  In addition, to help with your bowel movements, start taking MiraLAX, Metamucil, or Benefiber at least twice daily to help soften your stools.  Seek care in the emergency room if your pain does not improve significantly or if you develop fever, nausea/vomiting, decreased appetite, or flulike symptoms and are feeling very poorly.   ED Prescriptions     Medication Sig Dispense Auth. Provider   cephALEXin (KEFLEX) 500 MG capsule Take 1 capsule (500 mg total) by mouth 4 (four) times daily for 5 days. 20 capsule Valentino Nose, NP      PDMP not reviewed this encounter.   Valentino Nose, NP 01/13/23 1008

## 2023-01-13 NOTE — ED Triage Notes (Signed)
Patient states she had urinary frequency  1 week ago and  4 days ago she did a Teleconference and was prescribed Bactrim DS. Patient states she has decreased frequency, but is now having bladder pain and dysuria.

## 2023-01-15 LAB — URINE CULTURE: Culture: 30000 — AB

## 2023-04-04 ENCOUNTER — Encounter (HOSPITAL_BASED_OUTPATIENT_CLINIC_OR_DEPARTMENT_OTHER): Payer: Self-pay | Admitting: Pulmonary Disease

## 2023-04-04 ENCOUNTER — Other Ambulatory Visit (HOSPITAL_BASED_OUTPATIENT_CLINIC_OR_DEPARTMENT_OTHER): Payer: Self-pay | Admitting: Pulmonary Disease

## 2023-04-04 MED ORDER — ALBUTEROL SULFATE HFA 108 (90 BASE) MCG/ACT IN AERS: 2.0000 | INHALATION_SPRAY | Freq: Four times a day (QID) | RESPIRATORY_TRACT | 2 refills | Status: DC | PRN

## 2023-05-08 ENCOUNTER — Other Ambulatory Visit (HOSPITAL_BASED_OUTPATIENT_CLINIC_OR_DEPARTMENT_OTHER): Payer: Self-pay | Admitting: Pulmonary Disease

## 2023-06-29 ENCOUNTER — Encounter (HOSPITAL_BASED_OUTPATIENT_CLINIC_OR_DEPARTMENT_OTHER): Payer: Self-pay | Admitting: Pulmonary Disease

## 2023-06-29 ENCOUNTER — Ambulatory Visit (HOSPITAL_BASED_OUTPATIENT_CLINIC_OR_DEPARTMENT_OTHER): Payer: 59 | Admitting: Pulmonary Disease

## 2023-06-29 ENCOUNTER — Other Ambulatory Visit (HOSPITAL_COMMUNITY): Payer: Self-pay

## 2023-06-29 VITALS — BP 132/84 | HR 94 | Ht 59.0 in | Wt 159.2 lb

## 2023-06-29 DIAGNOSIS — J4489 Other specified chronic obstructive pulmonary disease: Secondary | ICD-10-CM

## 2023-06-29 MED ORDER — SPIRIVA RESPIMAT 2.5 MCG/ACT IN AERS: 2.0000 | INHALATION_SPRAY | Freq: Every day | RESPIRATORY_TRACT | 2 refills | Status: DC

## 2023-06-29 MED ORDER — ALBUTEROL SULFATE HFA 108 (90 BASE) MCG/ACT IN AERS: 2.0000 | INHALATION_SPRAY | Freq: Four times a day (QID) | RESPIRATORY_TRACT | 2 refills | Status: DC | PRN

## 2023-06-29 MED ORDER — SPIRIVA RESPIMAT 2.5 MCG/ACT IN AERS: 2.0000 | INHALATION_SPRAY | Freq: Every day | RESPIRATORY_TRACT | Status: DC

## 2023-06-29 NOTE — Progress Notes (Signed)
 Subjective:   PATIENT ID: Christine Shannon GENDER: female DOB: [birth year], MRN: [id number]  Chief Complaint  Patient presents with   Follow-up    COPD/Asthma    Reason for Visit: Follow-up  Christine Shannon is a 68 year old female former smoker with HTN, dysphagia s/p esophageal dilation 2024, reflux who presents for follow-up shortness of breath.  Initial consult Since the 6 months she reports worsening shortness of breath but endorses this has been a long term issues. She reports respiratory infections 1-2 x a year requiring antibiotics and steroids sometimes. She was seen in the ED for the first time in October 2023 (6 months ago) for upper URI with pneumonia. Returned to urgent care bc  she did not tolerate prednisone due to HTN and treated with z-pack on that visit. She will feel breathless with walking short distances (10 steps) but is still able to complete the task. She walks at a slower pace from her car/parking lot to work. She started walking 1/4-1/2 mile track and was able to do this. Denies wheezing and cough. She uses albuterol with activity.  08/18/22 Since our last visit she is using her albuterol prior to activity. Unsure if it helps. Continues to have unchanged shortness of breath especially with exertion or when carrying things. Feels she is able to walk on a 1/2 mile track without issues. No wheezing or cough.  12/15/22 Since our last visit she has shortness of breath when carrying or pulling her suitcase. Able to walk without carrying things. 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 Denies wheezing.  06/29/23 Since our last visit she reports she has good days and bad days with her breathing. She will still have some shortness of breath in the mornings when running to bathroom first thing in the morning. Denies cough or wheezing. Does have some chest congestion/phlegm. Uses her albuterol prior to  activity and long walks in her work parking lot which she feels it helps.  Social History: Quit smoking 03/2021. She will intermittently smoke socially 1-2 cigarettes Denies vaping  Environmental exposures: Works in [employer] basement 2 years   Past Medical History:  Diagnosis Date   Anxiety    Depression    Endometriosis    GERD (gastroesophageal reflux disease)    Infertility, female    Pneumonia 01/2022   Regurgitation    of heart   Urinary incontinence      Family History  Problem Relation Age of Onset   Breast cancer Mother 101       very early first stage BRACA negative   Stroke Mother    Prostate cancer Father    Diabetes Father    Stomach cancer Neg Hx    Colon cancer Neg Hx    Esophageal cancer Neg Hx    Pancreatic cancer Neg Hx      Social History   Occupational History   Not on file  Tobacco Use   Smoking status: Former    Current packs/day: 0.00    Average packs/day: 0.3 packs/day for 46.0 years (11.5 ttl pk-yrs)    Types: Cigarettes    Start date: 88    Quit date: 2022    Years since quitting: 3.1   Smokeless tobacco: Never   Tobacco comments:    Started age 60 stopped smoking daily 2022 currently smokes some days  Vaping Use   Vaping status: Never Used  Substance and  Sexual Activity   Alcohol use: Yes    Alcohol/week: 0.0 standard drinks of alcohol    Comment: 1-2 a month   Drug use: No   Sexual activity: Not Currently    Partners: Female, Female    Birth control/protection: Post-menopausal    No Known Allergies   Outpatient Medications Prior to Visit  Medication Sig Dispense Refill   fluticasone-salmeterol (ADVAIR) 250-50 MCG/ACT AEPB USE 1 INHALATION BY MOUTH IN THE MORNING AND AT BEDTIME 180 each 3   albuterol (VENTOLIN HFA) 108 (90 Base) MCG/ACT inhaler Inhale 2 puffs into the lungs every 6 (six) hours as needed for wheezing or shortness of breath. 24 g 2   Facility-Administered Medications Prior to Visit  Medication Dose Route  Frequency Provider Last Rate Last Admin   0.9 %  sodium chloride infusion  500 mL Intravenous Continuous Jenel Lucks, MD        Review of Systems  Constitutional:  Negative for chills, diaphoresis, fever, malaise/fatigue and weight loss.  HENT:  Negative for congestion.   Respiratory:  Positive for cough and shortness of breath. Negative for hemoptysis, sputum production and wheezing.   Cardiovascular:  Negative for chest pain, palpitations and leg swelling.     Objective:   Vitals:   06/29/23 1440  BP: 132/84  Pulse: 94  SpO2: 97%  Weight: 159 lb 3.2 oz (72.2 kg)  Height: 4\' 11"  (1.499 m)    SpO2: 97 %  Physical Exam: General: Well-appearing, no acute distress HENT: , AT Eyes: EOMI, no scleral icterus Respiratory: Clear to auscultation bilaterally.  No crackles, wheezing or rales Cardiovascular: RRR, -M/R/G, no JVD Extremities:-Edema,-tenderness Neuro: AAO x4, CNII-XII grossly intact Psych: Normal mood, normal affect  Data Reviewed:  Imaging: CXR 01/22/22 - Subtle opacities LLL base  PFT: 08/04/22 FVC 1.92 (73%) FEV1 1.10 (55%) Ratio 57  TLC 97% RV/TLC 132%  DLCO 79% Interpretation: Moderately severe obstructive defect with air trapping and mildly reduced DLCO. Significant bronchodilator response present   Labs: CBC    Component Value Date/Time   WBC 7.8 02/02/2022 0951   RBC 4.68 02/02/2022 0951   HGB 14.9 02/02/2022 0951   HCT 43.9 02/02/2022 0951   PLT 219.0 02/02/2022 0951   MCV 93.8 02/02/2022 0951   MCHC 34.0 02/02/2022 0951   RDW 15.3 02/02/2022 0951   LYMPHSABS 2.9 02/02/2022 0951   MONOABS 0.6 02/02/2022 0951   EOSABS 0.4 02/02/2022 0951   BASOSABS 0.0 02/02/2022 0951      Assessment & Plan:   Discussion: 68 year old female former smoker with HTN, dysphagia s/p esophageal dilation 2024, reflux who presents for follow-up. Reviewed PFTs with are consistent with COPD-asthma. Symptoms improved persistent shortness of breath. Will add  LAMA to ICS/LABA  Dyspnea likely related to untreated chronic lung disease and deconditioning. Discussed clinical course and management of COPD/asthma including bronchodilator regimen, exercise and action plan for exacerbation. Offered pulmonary rehab however patient has prior commitments on those days.  COPD-asthma - persistent symptoms --CONTINUE generic Advair 250-50 mcg ONE puff in the morning and evening. Rinse mouth after use --START Spiriva 2.5 mcg TWO puffs ONCE in the evening. CALL for refills if you feel this is effective --CONTINUE Albuterol AS NEEDED for shortness of breath or wheezing --Encourage walking at least 10,000 steps --Encourage upper body exercises 3 days a week for 20 min  Health Maintenance Immunization History  Administered Date(s) Administered   Tdap 08/13/2014   CT Lung Screen - none qualified  No orders  of the defined types were placed in this encounter.  Meds ordered this encounter  Medications   albuterol (VENTOLIN HFA) 108 (90 Base) MCG/ACT inhaler    Sig: Inhale 2 puffs into the lungs every 6 (six) hours as needed for wheezing or shortness of breath.    Dispense:  54 g    Refill:  2    Please dispense 3 inhalers. Fill as generic Proair please    Return in about 6 months (around 12/30/2023).   I have spent a total time of 35-minutes on the day of the appointment including chart review, data review, collecting history, coordinating care and discussing medical diagnosis and plan with the patient/family. Past medical history, allergies, medications were reviewed. Pertinent imaging, labs and tests included in this note have been reviewed and interpreted independently by me.  Allante Whitmire Mechele Collin, MD [facility] 06/29/2023 2:59 PM

## 2023-06-29 NOTE — Patient Instructions (Signed)
 COPD-asthma - persistent symptoms --CONTINUE generic Advair 250-50 mcg ONE puff in the morning and evening. Rinse mouth after use --START Spiriva 2.5 mcg TWO puffs ONCE in the evening. CALL for refills if you feel this is effective --CONTINUE Albuterol AS NEEDED for shortness of breath or wheezing --Encourage walking at least 10,000 steps --Encourage upper body exercises 3 days a week for 20 min

## 2023-08-03 ENCOUNTER — Telehealth: Admitting: Physician Assistant

## 2023-08-03 DIAGNOSIS — J4489 Other specified chronic obstructive pulmonary disease: Secondary | ICD-10-CM | POA: Diagnosis not present

## 2023-08-03 DIAGNOSIS — J302 Other seasonal allergic rhinitis: Secondary | ICD-10-CM

## 2023-08-03 DIAGNOSIS — H1013 Acute atopic conjunctivitis, bilateral: Secondary | ICD-10-CM

## 2023-08-03 MED ORDER — CETIRIZINE HCL 10 MG PO TABS: 10.0000 mg | ORAL_TABLET | Freq: Every day | ORAL | 0 refills | Status: AC

## 2023-08-03 MED ORDER — OLOPATADINE HCL 0.1 % OP SOLN: 1.0000 [drp] | Freq: Two times a day (BID) | OPHTHALMIC | 0 refills | Status: DC

## 2023-08-03 MED ORDER — CETIRIZINE HCL 10 MG PO TABS: 10.0000 mg | ORAL_TABLET | Freq: Every day | ORAL | 0 refills | Status: DC

## 2023-08-03 MED ORDER — OLOPATADINE HCL 0.1 % OP SOLN
1.0000 [drp] | Freq: Two times a day (BID) | OPHTHALMIC | 0 refills | Status: DC
Start: 2023-08-03 — End: 2024-02-07

## 2023-08-03 MED ORDER — FLUTICASONE PROPIONATE 50 MCG/ACT NA SUSP: 2.0000 | Freq: Every day | NASAL | 0 refills | Status: AC

## 2023-08-03 MED ORDER — PREDNISONE 20 MG PO TABS: 40.0000 mg | ORAL_TABLET | Freq: Every day | ORAL | 0 refills | Status: DC

## 2023-08-03 MED ORDER — FLUTICASONE PROPIONATE 50 MCG/ACT NA SUSP: 2.0000 | Freq: Every day | NASAL | 0 refills | Status: DC

## 2023-08-03 NOTE — Progress Notes (Signed)
 E visit for Allergic Rhinitis We are sorry that you are not feeling well.  Here is how we plan to help!  Based on what you have shared with me it looks like you have Allergic Rhinitis.  Rhinitis is when a reaction occurs that causes nasal congestion, runny nose, sneezing, and itching.  Most types of rhinitis are caused by an inflammation and are associated with symptoms in the eyes ears or throat. There are several types of rhinitis.  The most common are acute rhinitis, which is usually caused by a viral illness, allergic or seasonal rhinitis, and nonallergic or year-round rhinitis.  Nasal allergies occur certain times of the year.  Allergic rhinitis is caused when allergens in the air trigger the release of histamine in the body.  Histamine causes itching, swelling, and fluid to build up in the fragile linings of the nasal passages, sinuses and eyelids.  An itchy nose and clear discharge are common.  I recommend the following over the counter treatments: You should take a daily dose of antihistamine, I have prescribed Cetirizine 10mg  Take one tablet daily through allergy season  I also would recommend a nasal spray: I have prescribed Flonase 2 sprays into each nostril once daily  I have also prescribed Prednisone 20mg  Take 2 tablets (40mg ) daily for 5 days for shortness of breath and wheezing.  HOME CARE:  You can use an over-the-counter saline nasal spray as needed Avoid areas where there is heavy dust, mites, or molds Stay indoors on windy days during the pollen season Keep windows closed in home, at least in bedroom; use air conditioner. Use high-efficiency house air filter Keep windows closed in car, turn AC on re-circulate Avoid playing out with dog during pollen season  GET HELP RIGHT AWAY IF:  If your symptoms do not improve within 10 days You become short of breath You develop yellow or green discharge from your nose for over 3 days You have coughing fits  MAKE SURE  YOU:  Understand these instructions Will watch your condition Will get help right away if you are not doing well or get worse  Thank you for choosing an e-visit. Your e-visit answers were reviewed by a board certified advanced clinical practitioner to complete your personal care plan. Depending upon the condition, your plan could have included both over the counter or prescription medications. Please review your pharmacy choice. Be sure that the pharmacy you have chosen is open so that you can pick up your prescription now.  If there is a problem you may message your provider in MyChart to have the prescription routed to another pharmacy. Your safety is important to [place]. If you have drug allergies check your prescription carefully.  For the next 24 hours, you can use MyChart to ask questions about today's visit, request a non-urgent call back, or ask for a work or school excuse from your e-visit provider. You will get an email in the next two days asking about your experience. I hope that your e-visit has been valuable and will speed your recovery.       I have spent 5 minutes in review of e-visit questionnaire, review and updating patient chart, medical decision making and response to patient.   Margaretann Loveless, PA-C

## 2023-08-03 NOTE — Progress Notes (Signed)
 Patient completed EV earlier for same issue and had meds sent in. Needed pharmacy changed.   No charge

## 2023-09-21 ENCOUNTER — Telehealth: Admitting: Physician Assistant

## 2023-09-21 DIAGNOSIS — H0012 Chalazion right lower eyelid: Secondary | ICD-10-CM

## 2023-09-21 MED ORDER — MOXIFLOXACIN HCL 0.5 % OP SOLN: 1.0000 [drp] | Freq: Three times a day (TID) | OPHTHALMIC | 0 refills | Status: AC

## 2023-09-21 NOTE — Progress Notes (Signed)
 Virtual Visit Consent   [name], you are scheduled for a virtual visit with a [facility] provider today. Just as with appointments in the office, your consent must be obtained to participate. Your consent will be active for this visit and any virtual visit you may have with one of our providers in the next 365 days. If you have a MyChart account, a copy of this consent can be sent to you electronically.  As this is a virtual visit, video technology does not allow for your provider to perform a traditional examination. This may limit your provider's ability to fully assess your condition. If your provider identifies any concerns that need to be evaluated in person or the need to arrange testing (such as labs, EKG, etc.), we will make arrangements to do so. Although advances in technology are sophisticated, we cannot ensure that it will always work on either your end or our end. If the connection with a video visit is poor, the visit may have to be switched to a telephone visit. With either a video or telephone visit, we are not always able to ensure that we have a secure connection.  By engaging in this virtual visit, you consent to the provision of healthcare and authorize for your insurance to be billed (if applicable) for the services provided during this visit. Depending on your insurance coverage, you may receive a charge related to this service.  I need to obtain your verbal consent now. Are you willing to proceed with your visit today? Christine Shannon has provided verbal consent on 09/21/2023 for a virtual visit (video or telephone). [name], PA-C  Date: 09/21/2023 8:33 AM   Virtual Visit via Video Note   I, [name], connected with  Christine Shannon  ([id number], 1955-07-08) on 09/21/23 at  8:30 AM EDT by a video-enabled telemedicine application and verified that I am speaking with the correct person using two identifiers.  Location: Patient: Virtual Visit Location  Patient: Home Provider: Virtual Visit Location Provider: Home Office   I discussed the limitations of evaluation and management by telemedicine and the availability of in person appointments. The patient expressed understanding and agreed to proceed.    History of Present Illness: Christine Shannon is a 67 y.o. who identifies as a female who was assigned female at birth, and is being seen today for right eye lower lid swelling and some discomfort with blinking.  HPI: Eye Problem  The right eye is affected. This is a new problem. The current episode started yesterday. The problem occurs constantly. The problem has been gradually worsening. There was no injury mechanism. The pain is mild. There is Known exposure to pink eye. She Does not wear contacts. Associated symptoms include an eye discharge, eye redness, a foreign body sensation and itching. Pertinent negatives include no blurred vision, double vision, fever, nausea or photophobia. She has tried nothing for the symptoms. The treatment provided no relief.    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   Dysphagia 02/02/2022   History of esophageal stricture 02/02/2022   Gastroesophageal reflux disease 02/02/2022   Nausea and vomiting 02/02/2022   Diarrhea 02/02/2022   Abnormal colonoscopy 02/02/2022    Allergies: No Known Allergies Medications:  Current Outpatient Medications:    moxifloxacin (VIGAMOX) 0.5 % ophthalmic solution, Place 1 drop into the right eye 3 (three) times daily for 5 days., Disp: 3 mL, Rfl:  0   albuterol  (VENTOLIN  HFA) 108 (90 Base) MCG/ACT inhaler, Inhale 2 puffs into the lungs every 6 (six) hours as needed for wheezing or shortness of breath., Disp: 54 g, Rfl: 2   cetirizine  (ZYRTEC ) 10 MG tablet, Take 1 tablet (10 mg total) by mouth daily., Disp: 30 tablet, Rfl: 0   fluticasone  (FLONASE ) 50 MCG/ACT nasal spray, Place 2 sprays into both nostrils daily., Disp: 16  g, Rfl: 0   fluticasone -salmeterol (ADVAIR) 250-50 MCG/ACT AEPB, USE 1 INHALATION BY MOUTH IN THE MORNING AND AT BEDTIME, Disp: 180 each, Rfl: 3   olopatadine  (PATANOL) 0.1 % ophthalmic solution, Place 1 drop into both eyes 2 (two) times daily., Disp: 5 mL, Rfl: 0   predniSONE  (DELTASONE ) 20 MG tablet, Take 2 tablets (40 mg total) by mouth daily with breakfast., Disp: 10 tablet, Rfl: 0   Tiotropium Bromide Monohydrate  (SPIRIVA  RESPIMAT) 2.5 MCG/ACT AERS, Inhale 2 puffs into the lungs daily., Disp: , Rfl:    Tiotropium Bromide Monohydrate  (SPIRIVA  RESPIMAT) 2.5 MCG/ACT AERS, Inhale 2 puffs into the lungs daily., Disp: 4 g, Rfl: 2  Current Facility-Administered Medications:    0.9 %  sodium chloride  infusion, 500 mL, Intravenous, Continuous, Cunningham, Jaye Mettle, MD  Observations/Objective: Patient is well-developed, well-nourished in no acute distress.  Resting comfortably at home.  Head is normocephalic, atraumatic.  No labored breathing.  Speech is clear and coherent with logical content.  Patient is alert and oriented at baseline.  Right eye with mild injection and swollen lower lid with some mild soft tissue swelling just under the right eye at the lacrimal corner  Assessment and Plan: 1. Chalazion of right lower eyelid (Primary) - moxifloxacin (VIGAMOX) 0.5 % ophthalmic solution; Place 1 drop into the right eye 3 (three) times daily for 5 days.  Dispense: 3 mL; Refill: 0  - Suspect chalazion of the right lower lid - Vigamox prescribed - Warm compresses - Good hand hygiene - Seek in person evaluation if symptoms worsen or fail to improve   Follow Up Instructions: I discussed the assessment and treatment plan with the patient. The patient was provided an opportunity to ask questions and all were answered. The patient agreed with the plan and demonstrated an understanding of the instructions.  A copy of instructions were sent to the patient via MyChart unless otherwise noted below.     The patient was advised to call back or seek an in-person evaluation if the symptoms worsen or if the condition fails to improve as anticipated.    [name], PA-C

## 2023-09-21 NOTE — Patient Instructions (Signed)
 Christine Shannon, thank you for joining [name], PA-C for today's virtual visit.  While this provider is not your primary care provider (PCP), if your PCP is located in our provider database this encounter information will be shared with them immediately following your visit.   A [name] account gives you access to today's visit and all your visits, tests, and labs performed at [facility] " click here if you don't have a [name] account or go to [email or web address]  Consent: (Patient) Christine Shannon provided verbal consent for this virtual visit at the beginning of the encounter.  Current Medications:  Current Outpatient Medications:    moxifloxacin (VIGAMOX) 0.5 % ophthalmic solution, Place 1 drop into the right eye 3 (three) times daily for 5 days., Disp: 3 mL, Rfl: 0   albuterol  (VENTOLIN  HFA) 108 (90 Base) MCG/ACT inhaler, Inhale 2 puffs into the lungs every 6 (six) hours as needed for wheezing or shortness of breath., Disp: 54 g, Rfl: 2   cetirizine  (ZYRTEC ) 10 MG tablet, Take 1 tablet (10 mg total) by mouth daily., Disp: 30 tablet, Rfl: 0   fluticasone  (FLONASE ) 50 MCG/ACT nasal spray, Place 2 sprays into both nostrils daily., Disp: 16 g, Rfl: 0   fluticasone -salmeterol (ADVAIR) 250-50 MCG/ACT AEPB, USE 1 INHALATION BY MOUTH IN THE MORNING AND AT BEDTIME, Disp: 180 each, Rfl: 3   olopatadine  (PATANOL) 0.1 % ophthalmic solution, Place 1 drop into both eyes 2 (two) times daily., Disp: 5 mL, Rfl: 0   predniSONE  (DELTASONE ) 20 MG tablet, Take 2 tablets (40 mg total) by mouth daily with breakfast., Disp: 10 tablet, Rfl: 0   Tiotropium Bromide Monohydrate  (SPIRIVA  RESPIMAT) 2.5 MCG/ACT AERS, Inhale 2 puffs into the lungs daily., Disp: , Rfl:    Tiotropium Bromide Monohydrate  (SPIRIVA  RESPIMAT) 2.5 MCG/ACT AERS, Inhale 2 puffs into the lungs daily., Disp: 4 g, Rfl: 2  Current Facility-Administered Medications:    0.9 %  sodium chloride   infusion, 500 mL, Intravenous, Continuous, Cunningham, Scott E, MD   Medications ordered in this encounter:  Meds ordered this encounter  Medications   moxifloxacin (VIGAMOX) 0.5 % ophthalmic solution    Sig: Place 1 drop into the right eye 3 (three) times daily for 5 days.    Dispense:  3 mL    Refill:  0    Supervising Provider:   Corine Dice [[id number]]     *If you need refills on other medications prior to your next appointment, please contact your pharmacy*  Follow-Up: Call back or seek an in-person evaluation if the symptoms worsen or if the condition fails to improve as anticipated.  [facility] [phone]  Other Instructions Chalazion  A chalazion is a swelling or lump on the eyelid. It can affect the upper eyelid or the lower eyelid. What are the causes? This condition may be caused by: Long-lasting (chronic) inflammation of the eyelid glands. A blocked oil gland in the eyelid. What are the signs or symptoms? Symptoms of this condition include: Swelling of the eyelid that: May spread to areas around the eye. May be painful. A hard lump on the eyelid. Blurry vision. The lump may make it hard to see out of the eye. How is this diagnosed? This condition is diagnosed with an examination of the eye. How is this treated? This condition is treated by applying a warm, moist cloth (warm compress) to the eyelid. If the condition does not improve, it may be treated  with: Medicine that is applied to the eye. Oral medicines. Medicine that is injected into the chalazion. Surgery. Follow these instructions at home: Managing pain and swelling Apply a warm compress to the eyelid for 10-15 minutes, 4 to 6 times a day. This will help to open any blocked glands and to reduce redness and swelling. Take and apply over-the-counter and prescription medicines only as told by your health care provider. General instructions Do not touch the chalazion. Do not try to  remove the pus. Do not squeeze the chalazion or stick it with a pin or needle. Do not rub your eyes. Wash your hands often with soap and water for at least 20 seconds. Dry your hands with a clean towel. Keep your face, scalp, and eyebrows clean. Avoid wearing eye makeup. Keep all follow-up visits. This is important. Contact a health care provider if: Your eyelid is getting worse. You have a fever. The chalazion does not break open (rupture) or go away on its own and your eyelid has not improved for 4 weeks. Get help right away if: You have pain in your eye. Your vision worsens. The chalazion becomes painful or red. The chalazion gets bigger. Summary A chalazion is a swelling or lump on the upper or lower eyelid. It may be caused by chronic inflammation or a blocked oil gland. Apply a warm compress to the eyelid for 10-15 minutes, 4 to 6 times a day. Keep your face, scalp, and eyebrows clean. This information is not intended to replace advice given to you by your health care provider. Make sure you discuss any questions you have with your health care provider. Document Revised: 06/11/2020 Document Reviewed: 06/11/2020 Elsevier Patient Education  2024 Elsevier Inc.   If you have been instructed to have an in-person evaluation today at a local Urgent Care facility, please use the link below. It will take you to a list of all of our available [facility], including address, phone number and hours of operation. Please do not delay care.  [facility]  If you or a family member do not have a primary care provider, use the link below to schedule a visit and establish care. When you choose a [facility] primary care physician or advanced practice provider, you gain a long-term partner in health. Find a Primary Care Provider  Learn more about [facility]'s in-office and virtual care options:  - Get Care Now

## 2023-11-29 ENCOUNTER — Ambulatory Visit (HOSPITAL_BASED_OUTPATIENT_CLINIC_OR_DEPARTMENT_OTHER): Admitting: Pulmonary Disease

## 2024-01-04 ENCOUNTER — Ambulatory Visit (HOSPITAL_BASED_OUTPATIENT_CLINIC_OR_DEPARTMENT_OTHER): Admitting: Pulmonary Disease

## 2024-01-31 ENCOUNTER — Encounter (HOSPITAL_BASED_OUTPATIENT_CLINIC_OR_DEPARTMENT_OTHER): Payer: Self-pay | Admitting: Pulmonary Disease

## 2024-01-31 ENCOUNTER — Ambulatory Visit (HOSPITAL_BASED_OUTPATIENT_CLINIC_OR_DEPARTMENT_OTHER): Admitting: Pulmonary Disease

## 2024-01-31 VITALS — BP 133/84 | HR 76 | Ht 59.0 in | Wt 163.6 lb

## 2024-01-31 DIAGNOSIS — J4489 Other specified chronic obstructive pulmonary disease: Secondary | ICD-10-CM | POA: Diagnosis not present

## 2024-01-31 DIAGNOSIS — Z87891 Personal history of nicotine dependence: Secondary | ICD-10-CM | POA: Diagnosis not present

## 2024-01-31 MED ORDER — SPIRIVA RESPIMAT 2.5 MCG/ACT IN AERS: 2.0000 | INHALATION_SPRAY | Freq: Every day | RESPIRATORY_TRACT | 11 refills | Status: AC

## 2024-01-31 MED ORDER — ALBUTEROL SULFATE HFA 108 (90 BASE) MCG/ACT IN AERS: 2.0000 | INHALATION_SPRAY | Freq: Four times a day (QID) | RESPIRATORY_TRACT | 2 refills | Status: AC | PRN

## 2024-01-31 MED ORDER — FLUTICASONE-SALMETEROL 250-50 MCG/ACT IN AEPB: 1.0000 | INHALATION_SPRAY | Freq: Two times a day (BID) | RESPIRATORY_TRACT | 3 refills | Status: AC
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
## 2024-01-31 NOTE — Patient Instructions (Signed)
 COPD-asthma - persistent symptoms --CONTINUE generic Advair 250-50 mcg ONE puff in the morning and evening. Rinse mouth after use --CONTINUE Spiriva  2.5 mcg TWO puffs ONCE in the evening.  --CONTINUE Albuterol  AS NEEDED for shortness of breath or wheezing --Encourage walking at least 10,000 steps --Encourage upper body exercises 3 days a week for 20 min

## 2024-05-16 ENCOUNTER — Ambulatory Visit (HOSPITAL_BASED_OUTPATIENT_CLINIC_OR_DEPARTMENT_OTHER): Admitting: Pulmonary Disease

## 2024-06-04 ENCOUNTER — Ambulatory Visit (HOSPITAL_BASED_OUTPATIENT_CLINIC_OR_DEPARTMENT_OTHER): Admitting: Pulmonary Disease
